# Patient Record
Sex: Female | Born: 1965 | Race: Asian | Hispanic: No | Marital: Married | Smoking: Never smoker
Health system: Southern US, Community
[De-identification: ages and names within clinical notes are randomized; demographics above are authoritative.]

## PROBLEM LIST (undated history)

## (undated) DIAGNOSIS — A159 Respiratory tuberculosis unspecified: Secondary | ICD-10-CM

## (undated) DIAGNOSIS — F419 Anxiety disorder, unspecified: Secondary | ICD-10-CM

## (undated) DIAGNOSIS — K219 Gastro-esophageal reflux disease without esophagitis: Secondary | ICD-10-CM

## (undated) HISTORY — DX: Respiratory tuberculosis unspecified: A15.9

## (undated) HISTORY — DX: Anxiety disorder, unspecified: F41.9

## (undated) HISTORY — DX: Gastro-esophageal reflux disease without esophagitis: K21.9

---

## 2000-02-02 ENCOUNTER — Encounter: Payer: Self-pay | Admitting: Obstetrics

## 2000-02-02 ENCOUNTER — Inpatient Hospital Stay (HOSPITAL_COMMUNITY): Admission: AD | Admit: 2000-02-02 | Discharge: 2000-02-02 | Payer: Self-pay | Admitting: Obstetrics & Gynecology

## 2000-02-04 ENCOUNTER — Inpatient Hospital Stay (HOSPITAL_COMMUNITY): Admission: AD | Admit: 2000-02-04 | Discharge: 2000-02-04 | Payer: Self-pay | Admitting: Obstetrics

## 2000-09-26 ENCOUNTER — Inpatient Hospital Stay (HOSPITAL_COMMUNITY): Admission: AD | Admit: 2000-09-26 | Discharge: 2000-09-29 | Payer: Self-pay | Admitting: Obstetrics and Gynecology

## 2001-11-08 ENCOUNTER — Other Ambulatory Visit: Admission: RE | Admit: 2001-11-08 | Discharge: 2001-11-08 | Payer: Self-pay | Admitting: Obstetrics and Gynecology

## 2002-11-15 ENCOUNTER — Other Ambulatory Visit: Admission: RE | Admit: 2002-11-15 | Discharge: 2002-11-15 | Payer: Self-pay | Admitting: Obstetrics and Gynecology

## 2002-11-20 ENCOUNTER — Ambulatory Visit (HOSPITAL_COMMUNITY): Admission: RE | Admit: 2002-11-20 | Discharge: 2002-11-20 | Payer: Self-pay | Admitting: Obstetrics and Gynecology

## 2002-11-20 ENCOUNTER — Encounter: Payer: Self-pay | Admitting: Obstetrics and Gynecology

## 2002-12-01 ENCOUNTER — Ambulatory Visit (HOSPITAL_BASED_OUTPATIENT_CLINIC_OR_DEPARTMENT_OTHER): Admission: RE | Admit: 2002-12-01 | Discharge: 2002-12-01 | Payer: Self-pay | Admitting: General Surgery

## 2002-12-01 ENCOUNTER — Encounter (INDEPENDENT_AMBULATORY_CARE_PROVIDER_SITE_OTHER): Payer: Self-pay | Admitting: *Deleted

## 2003-01-01 ENCOUNTER — Encounter: Admission: RE | Admit: 2003-01-01 | Discharge: 2003-01-01 | Payer: Self-pay | Admitting: Infectious Diseases

## 2003-01-08 ENCOUNTER — Encounter: Admission: RE | Admit: 2003-01-08 | Discharge: 2003-01-08 | Payer: Self-pay | Admitting: Infectious Diseases

## 2004-06-27 ENCOUNTER — Ambulatory Visit (HOSPITAL_COMMUNITY): Admission: RE | Admit: 2004-06-27 | Discharge: 2004-06-27 | Payer: Self-pay | Admitting: Obstetrics and Gynecology

## 2004-07-14 ENCOUNTER — Other Ambulatory Visit: Admission: RE | Admit: 2004-07-14 | Discharge: 2004-07-14 | Payer: Self-pay | Admitting: Obstetrics and Gynecology

## 2004-09-04 ENCOUNTER — Ambulatory Visit (HOSPITAL_COMMUNITY): Admission: RE | Admit: 2004-09-04 | Discharge: 2004-09-04 | Payer: Self-pay | Admitting: Obstetrics and Gynecology

## 2004-09-11 ENCOUNTER — Ambulatory Visit (HOSPITAL_COMMUNITY): Admission: RE | Admit: 2004-09-11 | Discharge: 2004-09-11 | Payer: Self-pay | Admitting: Obstetrics and Gynecology

## 2005-01-26 ENCOUNTER — Inpatient Hospital Stay (HOSPITAL_COMMUNITY): Admission: AD | Admit: 2005-01-26 | Discharge: 2005-01-29 | Payer: Self-pay | Admitting: Obstetrics and Gynecology

## 2006-07-28 ENCOUNTER — Other Ambulatory Visit: Admission: RE | Admit: 2006-07-28 | Discharge: 2006-07-28 | Payer: Self-pay | Admitting: Family Medicine

## 2006-11-21 IMAGING — US US OB COMP LESS 14 WK
1 series · 18 of 24 positions shown · non-contrast
Comparison: none

CLINICAL DATA: 7-week 5-day gestational age by LMP.  Vaginal bleeding and cramping. 
 OBSTETRICAL ULTRASOUND:
 A single living intrauterine gestation is seen with measured heart rate of 158.  Embryonic crown rump length measures 1.6 cm, corresponding to a gestational age of 8 weeks 1 day.  A normal-appearing yolk sac is seen.  There is no evidence of subchorionic hemorrhage, and no other maternal uterine abnormalities were seen.  
 The left ovary is normal in appearance.  The right ovary contains a corpus luteum cyst measuring 2.6 x 2.2 x 2.6 cm.  No adnexal masses or free fluid are identified.

[Series 1: us ob comp<14 wk · 18 of 24 slices shown]
[im 1/24]
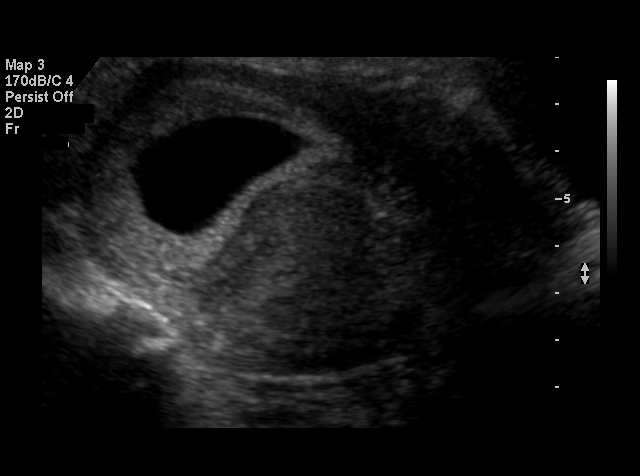
[im 3/24]
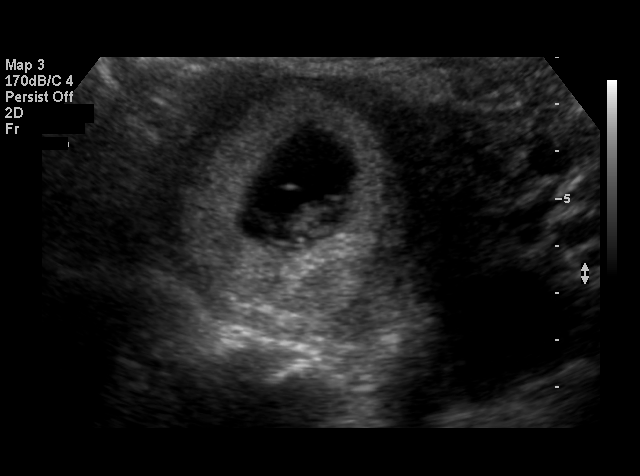
[im 4/24]
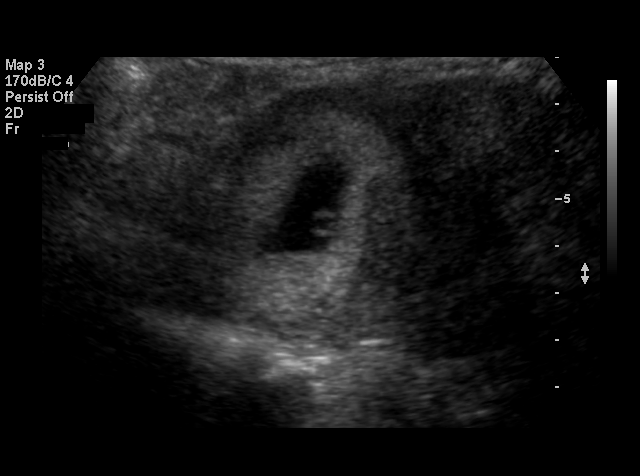
[im 5/24]
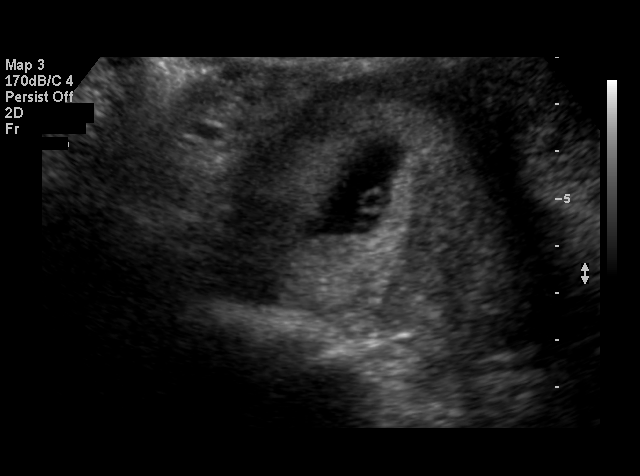
[im 7/24]
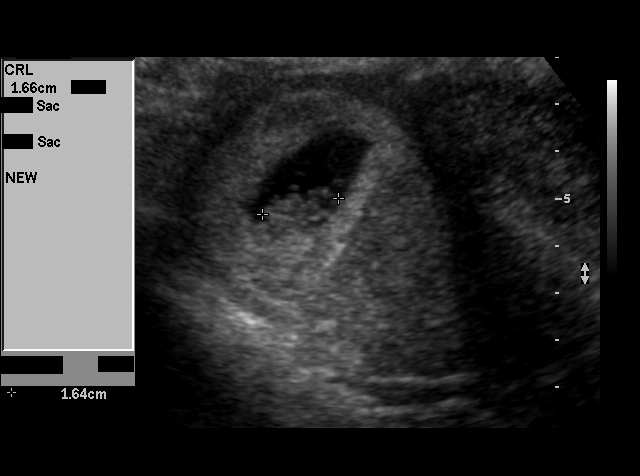
[im 8/24]
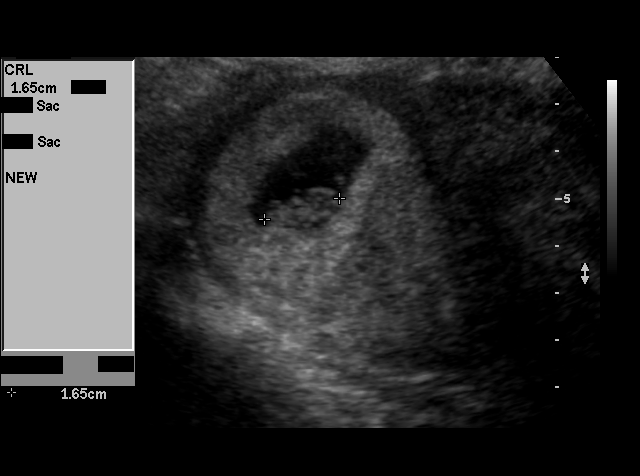
[im 9/24]
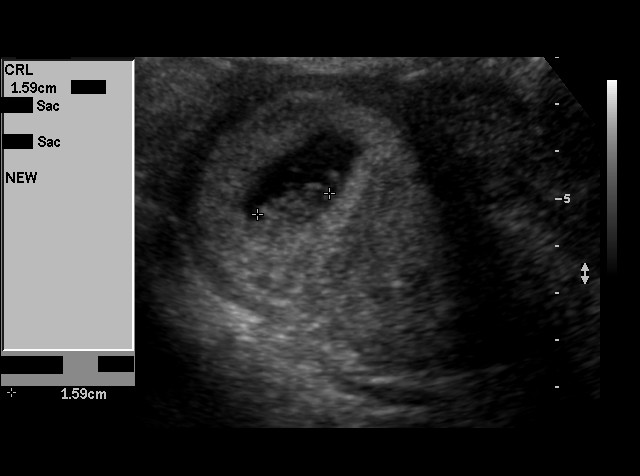
[im 11/24]
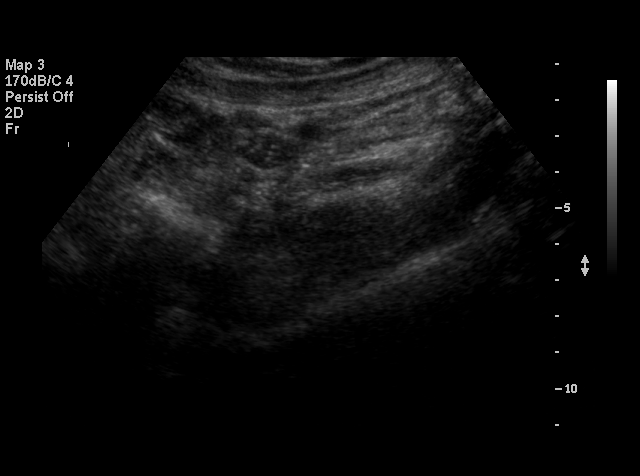
[im 12/24]
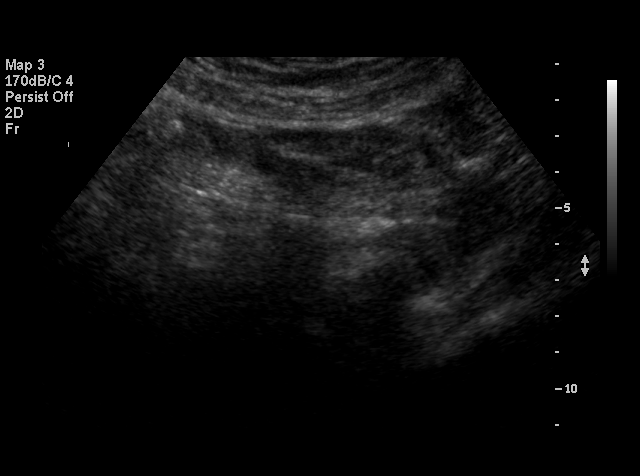
[im 13/24]
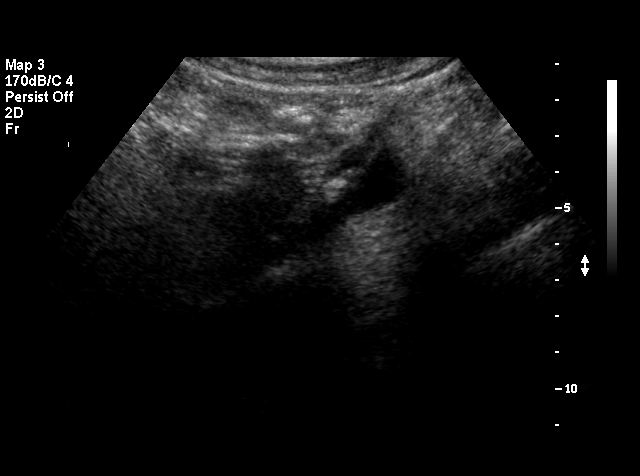
[im 15/24]
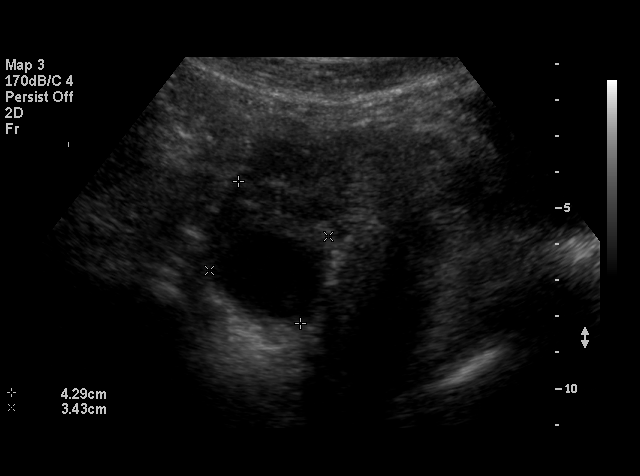
[im 16/24]
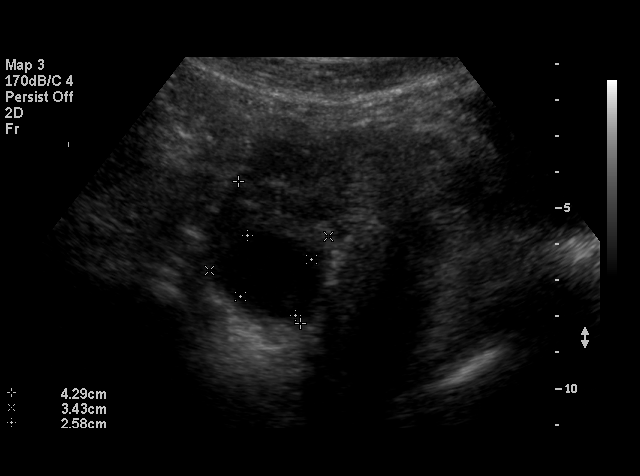
[im 17/24]
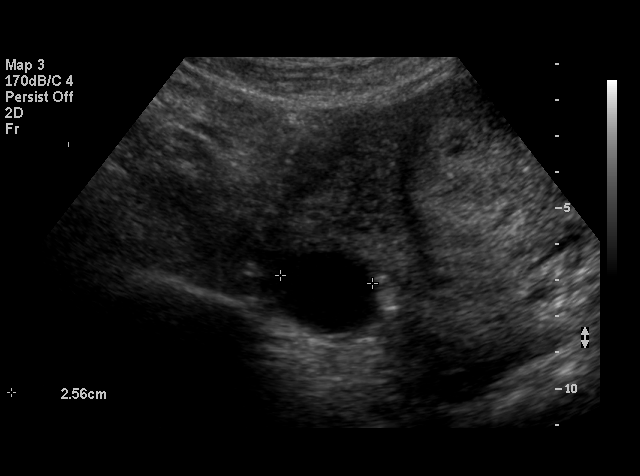
[im 19/24]
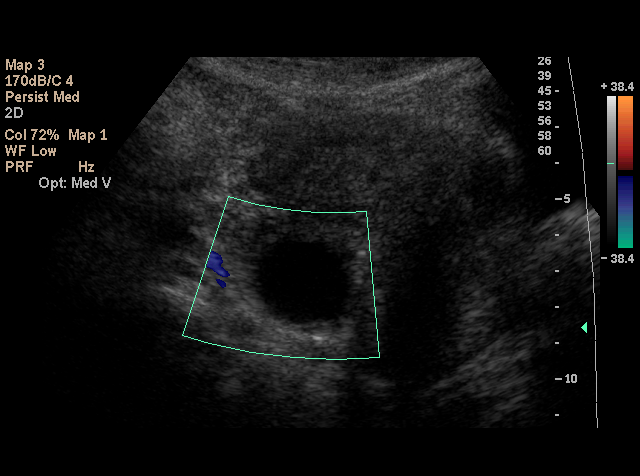
[im 20/24]
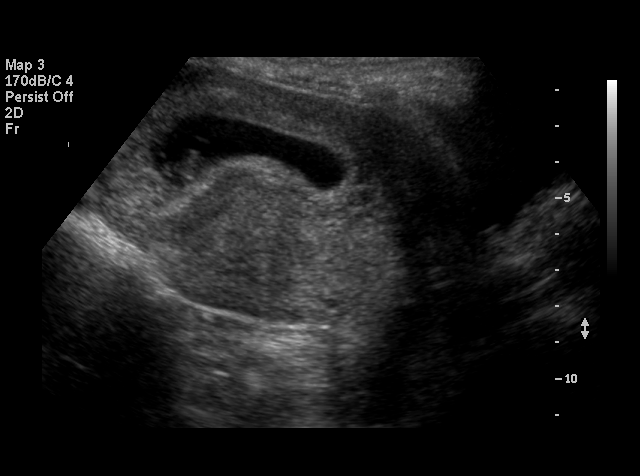
[im 21/24]
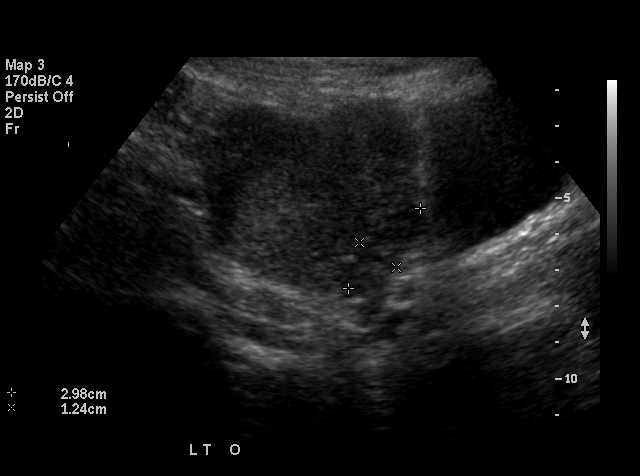
[im 23/24]
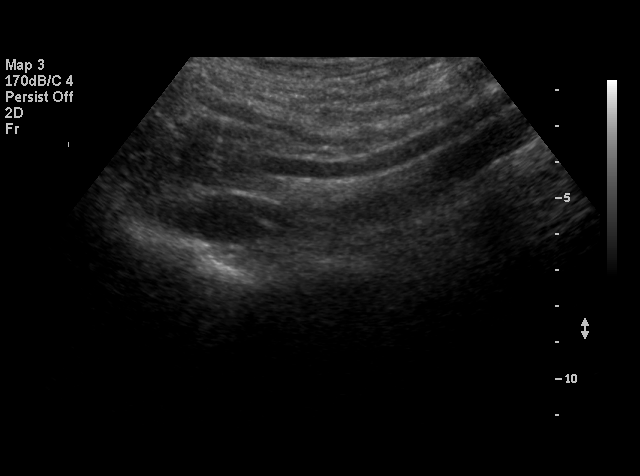
[im 24/24]
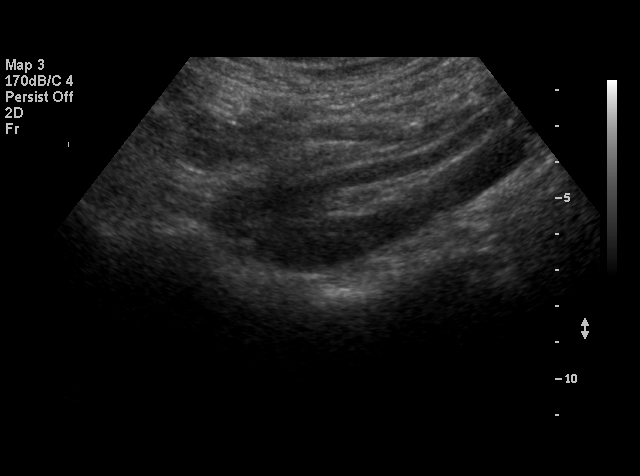

[18 of 24 positions shown; findings below may reference images not displayed]

IMPRESSION: 1.  Single living intrauterine gestation with estimated gestational age of 8 weeks 1 day and sonographic EDC of 02/05/05.  This correlates well with LMP.  
 [DATE] cm right ovarian corpus luteum noted.

## 2007-02-05 IMAGING — US US AMNIOCENTESIS
1 series · 5 of 5 positions shown · non-contrast
Comparison: none

[Series 1: us amniocentesis · 5 of 5 slices shown]
[im 1/5]
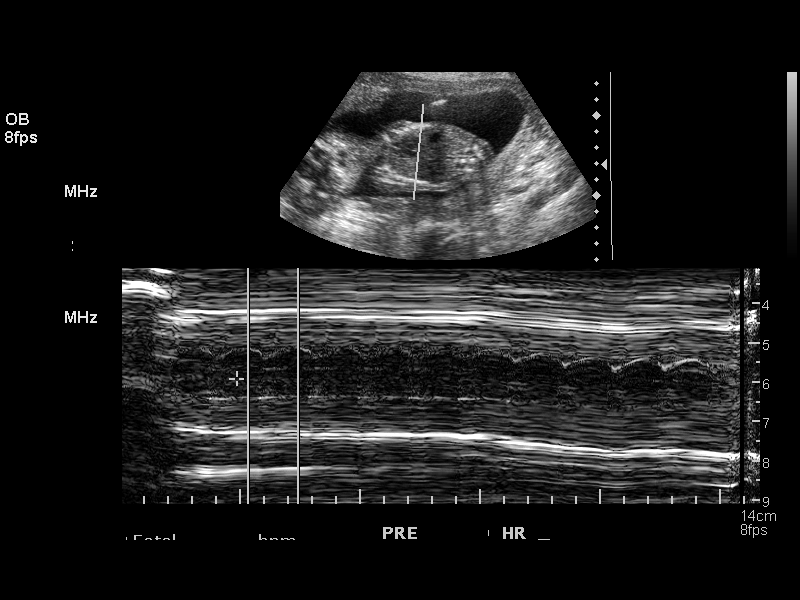
[im 2/5]
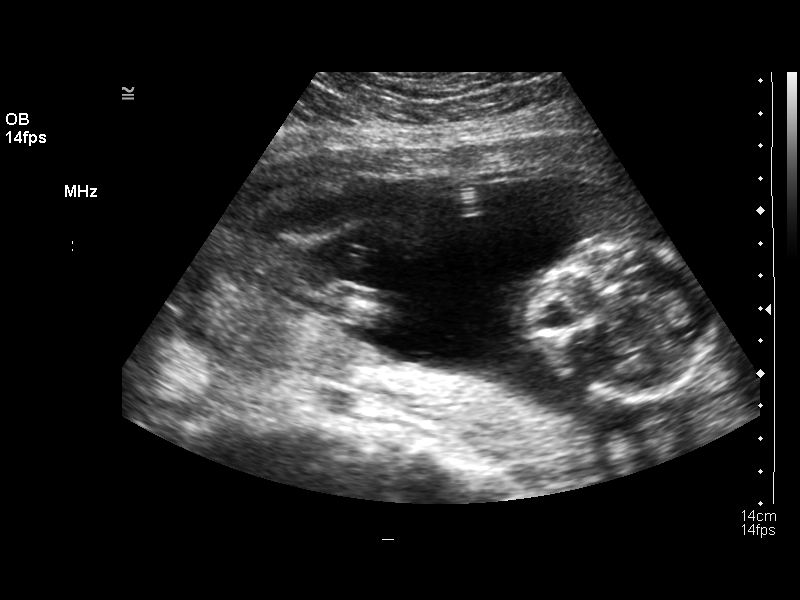
[im 3/5]
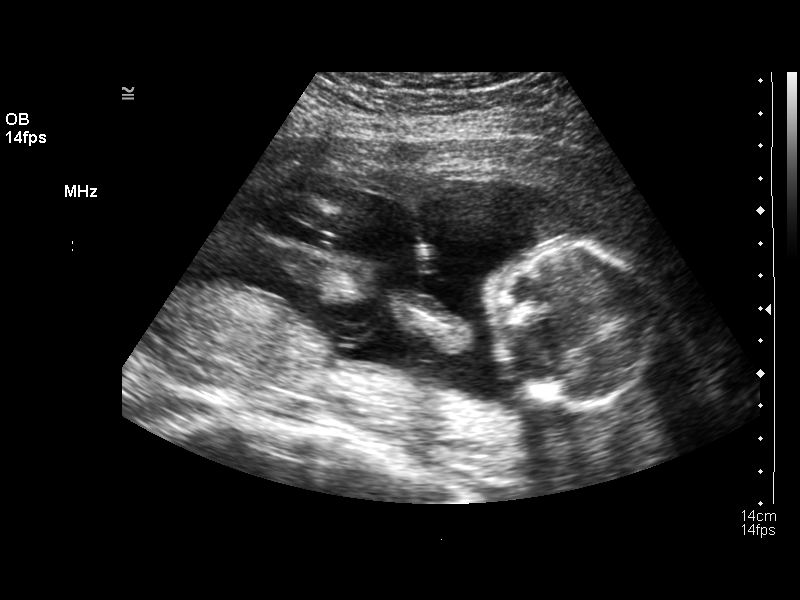
[im 4/5]
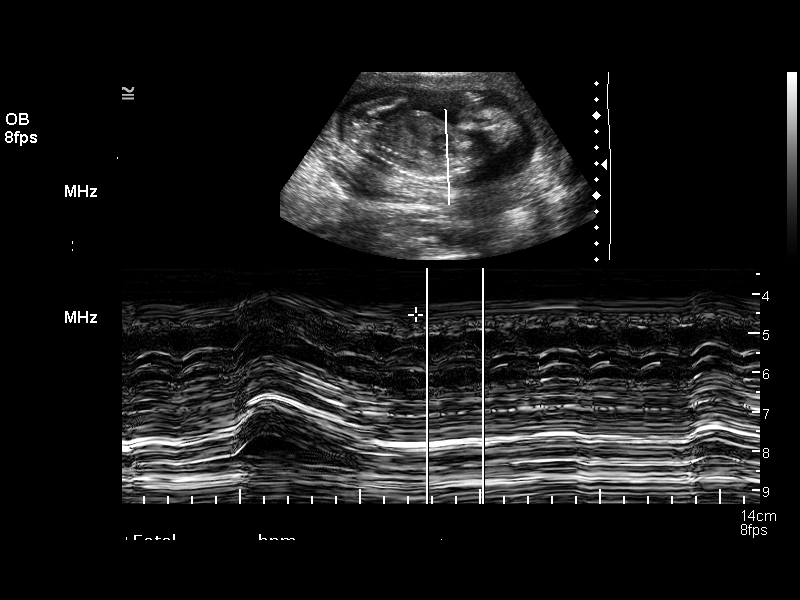
[im 5/5]
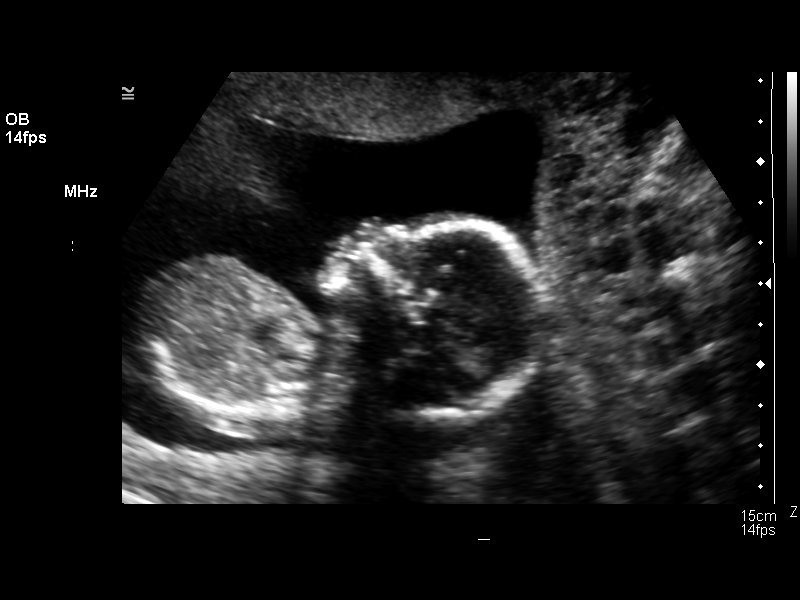

[5 of 5 positions shown; findings below may reference images not displayed]

ULTRASOUND AMNIOCENTESIS:

 Ultrasound was utilized to perform amniocentesis by the requesting physician.

## 2008-10-22 ENCOUNTER — Other Ambulatory Visit: Admission: RE | Admit: 2008-10-22 | Discharge: 2008-10-22 | Payer: Self-pay | Admitting: Family Medicine

## 2010-03-17 ENCOUNTER — Other Ambulatory Visit: Admission: RE | Admit: 2010-03-17 | Discharge: 2010-03-17 | Payer: Self-pay | Admitting: Family Medicine

## 2010-11-07 NOTE — Op Note (Signed)
NAMEJALIE, EILAND                   ACCOUNT NO.:  [id number]   MEDICAL RECORD NO.:  [id number]          PATIENT TYPE:  INP   LOCATION:  9128                          FACILITY:  [facility]   PHYSICIAN:  Crist Fat. Rivard, M.D. DATE OF BIRTH:  03/28/1966   DATE OF PROCEDURE:  01/26/2005  DATE OF DISCHARGE:                                 OPERATIVE REPORT   PREOPERATIVE DIAGNOSIS:  Intrauterine pregnancy at 38 weeks 4 days in early  labor with previous cesarean section, who desires repeat cesarean section.   POSTOPERATIVE DIAGNOSIS:  Intrauterine pregnancy at 38 weeks 4 days in early  labor with previous cesarean section, who desires repeat cesarean section.   ANESTHESIA:  Spinal.   PROCEDURE:  Repeat low transverse cesarean section.   SURGEON:  Crist Fat. Rivard, M.D.   ASSISTANTRenaldo Reel. Latham, C.N.M.   ESTIMATED BLOOD LOSS:  500 mL.   PROCEDURE:  After being informed of the planned procedure with possible  complications including bleeding, infection, injury to bowels, bladder and  ureters, through the translation line, informed consent is obtained.  The  patient is taken to cesarean suite and given spinal anesthesia without  complication.  She is placed in a dorsal decubitus position, pelvis tilted  to the left, prepped and draped in a sterile fashion, and a Foley catheter  is inserted in her bladder.  After assessing adequate level of anesthesia,  her previous incision is infiltrated with 20 mL of Marcaine 0.25% and we  perform a Pfannenstiel incision overlying the first incision, which is  brought down sharply to the fascia.  Fascia is opened in a low transverse  fashion.  Linea alba is dissected.  Peritoneum is entered sharply.  Visceral  peritoneum is entered in a low transverse fashion allowing [place] to safely  retract bladder by developing a bladder flap.  Myometrium is then opened in low transverse fashion first sharply, then  extended bluntly.  Amniotic fluid is clear and  abundant.  We assist the  birth of a female infant in vertex presentation at 9:55 a.m.  Mouth and nose  are suctioned.  Baby is delivered.  Cord is clamped with two Kelly clamps  and sectioned and the baby is given to the pediatrician present in the room.  Ancef 2 g IV was given to the patient and 20 mL of blood is drawn from the  umbilical vein.  The placenta is manually extracted, is complete, cord has  three vessels, and uterine revision is negative.   Myometrium is closed in two layers, first with a running locked suture of 0  Vicryl, then with a Lembert suture of 0 Vicryl imbricating the first one.  Hemostasis is completed with cautery on the peritoneal edges.  Hemostasis  was then deemed adequate.  Both paracolic gutters are cleansed, both tubes  and ovaries assessed and normal.  Pelvis is then profusely irrigated with  warm saline and hemostasis is rechecked and again deemed adequate.   Under fascia hemostasis is completed with cautery and fascia is closed with  two running sutures of #1  Vicryl meeting midline.  Incision is irrigated  with warm saline, hemostasis was completed with cautery, and skin is closed  with a subcuticular suture of 4-0 Monocryl and Steri-Strips.   Instrument and sponge count is complete x2.  Estimated blood loss is 500 mL.  The procedure is well tolerated by the patient, who is taken to recovery  room in a well and stable condition.   A little girl named Joylene Draft was born at 9:55 a.m., received an Apgar of 9 at  one minutes, 9 at five minutes, and weighs 6 pounds 12 ounces.       SAR/MEDQ  D:  01/26/2005  T:  01/26/2005  Job:  [id number]

## 2010-11-07 NOTE — Op Note (Signed)
Jenny Mckenzie, Jenny Mckenzie                               ACCOUNT NO.:  [id number]   MEDICAL RECORD NO.:  [id number]                   PATIENT TYPE:  AMB   LOCATION:  DSC                                  FACILITY:  [facility]   PHYSICIAN:  Anselm Pancoast. Zachery Dakins, M.D.          DATE OF BIRTH:  1965/12/04   DATE OF PROCEDURE:  12/01/2002  DATE OF DISCHARGE:                                 OPERATIVE REPORT   PREOPERATIVE DIAGNOSIS:  Cervical lymphadenopathy.   POSTOPERATIVE DIAGNOSIS:  Await pathology.   OPERATION:  Excision of three cervical lymph nodes, midchain.   ANESTHESIA:  Local anesthesia.   ASSISTANT:  Nurse.   HISTORY:  [name] is a 45 year old [place] female who was referred to [place]  for cervical lymphadenopathy.  The patient has had some cervical enlarged  nodes for the last couple of weeks.  They are slightly tender.  She was  treated with Keflex for a week with no improvement and was referred for  surgical biopsy.  Her previous history was that about 20 years ago while  living in [place] had a similar cervical lymph node biopsy on the opposite  side.  There was some type of infection treated with antibiotics, and she  does not know the details.  Her English is fairly limited, but her sister-in-  law, who has accompanied her, does most of the translating.  On exam, there  are obviously cervical nodes, not one big node like a usual lymphoma, but  the largest cluster is in the midcervical area.  I recommended that we make  a little incision over this and excise the areas and then send them for path  exam and also save one for culture if it is an infectious process.   PROCEDURE IN DETAIL:  The area picked was marked, and then a small area  infiltrated with Xylocaine with adrenalin.  A small curved, straight  incision in the skin fold lines were made, and the underlying subcutaneous  tissue was separated from the underlying platysma muscle.  I then made a  little vertical incision  between the muscle fibers, exposing the larger of  the nodes and then dissected after anesthetizing this with a few mL of the  same solution so that two of the nodes, which are 1.5 cm in size could be  freed up.  There was a little, I think, sensory nerve then identified, and  this was protected at the posterior portion of the wound.  I freed up the  areas.  The fascicle of pedicle was ligated with a 4-0 Vicryl, and then the  larger of the nodes, I cut it in half, saved the smaller portion in a  sterile container, and sent over together so Dr. Clelia Croft can examine the  specimen, do touch preps, and etc., and then do any cultures as she thinks  is appropriate.  The procedure was tolerated nicely.  The little muscle area  was closed with a 4-0 Vicryl.  There was minimal bleeding and then the  subcuticular 4-0 Vicryl with two sutures and Benzoin and Steri-Strips on the  skin incision.  We will see her back on Tuesday and will have the path  report.  She is not on any antibiotics.  We will give her Vicodin for pain.  Instructed to keep the area dry until she is seen in the office on Tuesday.                                               Anselm Pancoast. Zachery Dakins, M.D.    WJW/MEDQ  D:  12/01/2002  T:  12/01/2002  Job:  [id number]

## 2010-11-07 NOTE — Op Note (Signed)
[facility]  Patient:    Jenny Mckenzie, Jenny Mckenzie Visit Number: [id number] MRN: [id number]          Service Type: OBS Location: 9300 [id number] Attending Physician:  Shaune Spittle Proc. Date: 09/26/00 Admit Date:  09/26/2000                             Operative Report  PREOPERATIVE DIAGNOSIS:       1. Intrauterine pregnancy at term.                               2. Spontaneous rupture of membranes in labor.                               3. Breach presentation.  POSTOPERATIVE DIAGNOSIS:      1. Intrauterine pregnancy at term.                               2. Spontaneous rupture of membranes in labor.                               3. Breach presentation.  OPERATION:                    Primary low transverse cesarean section.  SURGEON:                      Vanessa P. Pennie Rushing, M.D.  ASSISTANT:                    Mack Guise, C.N.M.  ANESTHESIA:                   Spinal.  ESTIMATED BLOOD LOSS:         750 cc  COMPLICATIONS:                None.  FINDINGS:                     The patient was delivered of a female infant whose name is Maureen Ralphs, weighing 6 pounds 4 ounces with Apgars of 8/9 at 1 and 5 minutes, respectively.  She had a double nuchal cord.  The uterus, tubes, and ovaries were normal for the gravid state.  DESCRIPTION OF PROCEDURE:     The patient was taken to the operating room after appropriate identification and placed on the operating table.  After placement of a spinal anesthetic, she was placed in the supine position with the left lateral tilt.  The abdomen and perineum prepped with multiple layers of Betadine and a Foley catheter inserted into the bladder under sterile conditions and connected to straight drainage.  The abdomen was draped as a sterile field.  A transverse incision was made in the abdomen after assurance of adequate anesthesia and the abdomen opened in layers.  The peritoneum was entered.  The uterus was  incised approximately 1 cm above the uterovesical fold, and the infant delivered from the left sacrum anterior position as a frank breach.  The cord was clamped and cut, and the infant handed off to the awaiting pediatricians.  Appropriate cord blood was drawn and placenta spontaneously separated from the  uterus and was removed from the operative field.  The uterine incision was closed with a running interlocking suture of 0 Vicryl.  An imbricating suture of 0 Vicryl was placed.  Hemostasis was noted to be adequate.  Copious irrigation carried out and the abdominal peritoneum was closed with a running suture of 2-0 Vicryl.  The rectus muscles were reapproximated in the midline with figure-of-eight sutures of 2-0 Vicryl.  The rectus muscles were noted to be hemostatic and were irrigated.  The rectus fascia was closed with a running suture of 0 Vicryl then reinforced on either side of midline with figure-of-eight sutures of 0 Vicryl.  The subcutaneous tissue was irrigated and made hemostatic with Bovie cautery.  Skin staples were applied to the skin incision.  A sterile dressing was applied and the patient was taken from the operating room to the recovery room in satisfactory condition having tolerated the procedure well with sponge and instrument counts correct.  The infant went to the full-term nursery. Attending Physician:  Shaune Spittle DD:  09/26/00 TD:  09/27/00 Job: [id number]

## 2010-11-07 NOTE — Discharge Summary (Signed)
[name], [name]                   ACCOUNT NO.:  [id number]   MEDICAL RECORD NO.:  [id number]          PATIENT TYPE:  INP   LOCATION:  9128                          [facility]ACILITY:  [facility]   PHYSICIAN:  Hal Morales, M.D.DATE OF BIRTH:  10-12-1965   DATE OF ADMISSION:  01/26/2005  DATE OF DISCHARGE:                                 DISCHARGE SUMMARY   ADMITTING DIAGNOSES:  1.  Intrauterine pregnancy at term.  2.  Previous cesarean section.   DISCHARGE DIAGNOSES:  1.  Intrauterine pregnancy at term.  2.  Repeat low transverse cesarean section.   PROCEDURES:  Repeat low transverse cesarean section.   HOSPITAL COURSE:  Jenny Mckenzie is a 45 year old gravida 2 para 1-0-0-1 who  presented on January 26, 2005 in spontaneous onset of labor. Pregnancy has  been remarkable for:  1.  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  2.  History of previous low transverse cesarean section. The patient desired      repeat low transverse cesarean section.   The patient was taken to cesarean section and delivered a viable female  infant, Renea Ee, weight 6 pounds 12 ounces with Apgars of 9 and 9 at one and  five minutes respectively. The patient was breastfeeding. Hemoglobin was  12.0 on day #1 postpartum. By day #3 postpartum and postoperatively, the  patient was doing well and was deemed to have received the full benefit of  her hospital stay. She was discharged home. The patient desires to consider  contraception and will have a full decision at her 6-week office visit. The  patient has reported some intermittent nausea and upper abdominal pain  during her hospital stay which she reports was present prior to pregnancy  for which she requests medication.   DISCHARGE CONDITION:  Stable.   DISCHARGE INSTRUCTIONS:  Per [facility] OB/GYN handout.   DISCHARGE MEDICATIONS:  1.  Motrin 600 mg p.o. q.6h. p.r.n. pain.  2.  Tylox one to two p.o. q.3-4h. p.r.n. pain.  3.  Prenatal vitamins one p.o. daily.  4.  Nexium 40 mg  p.o. daily.   DISCHARGE FOLLOW-UP:  Will occur at 6 weeks postpartum at [facility].     ______________________________  Elsie Ra, C.N.M.      Hal Morales, M.D.  Electronically Signed    NS/MEDQ  D:  01/29/2005  T:  01/29/2005  Job:  [id number]

## 2010-11-07 NOTE — Op Note (Signed)
Jenny Mckenzie, Jenny Mckenzie                   ACCOUNT NO.:  [id number]   MEDICAL RECORD NO.:  [id number]          PATIENT TYPE:  OUT   LOCATION:  ULT                           [facility]   PHYSICIAN:  Hal Morales, M.D.DATE OF BIRTH:  July 17, 1965   DATE OF PROCEDURE:  09/11/2004  DATE OF DISCHARGE:                                 OPERATIVE REPORT   PREOPERATIVE DIAGNOSES:  1.  Intrauterine pregnancy at 18 weeks' gestation.  2.  Increased risk of Down syndrome.  3.  Advanced maternal age.   POSTOPERATIVE DIAGNOSES:  1.  Intrauterine pregnancy at 18 weeks' gestation.  2.  Increased risk of Down syndrome on maternal serum screening.  3.  Advanced maternal age.   OPERATION:  Genetic amniocentesis.   ANESTHESIA:  Local.   ESTIMATED BLOOD LOSS:  Less than 5 mL.   COMPLICATIONS:  None.   FINDINGS:  The patient has an anterior placenta and adequate amniotic fluid.  Blood type B positive   PROCEDURE:  After a preliminary ultrasound identifying a pocket of fluid  near the umbilicus and to the right of midline, this area was cleansed with  Betadine and infiltrated with 1% Xylocaine.  Initially, the 20 gauge  UltraView needle was used to try and access the fluid pocket; however, the  infant quickly moved into the pocket of fluid which had been intended for  use.  The needle was noted to contain a small amount of blood and was  retracted with the Stylet in place.  Ultrasound was repeated and the pocket  of fluid again identified to be empty of fetal parts.  A #22 gauge needle  was then used to access the pocket of amniotic fluid.  The fluid was  initially blood-tinged but cleared rapidly.  Then 5 mL were placed in the  first syringe and 10 mL in the second syringe.  The post amniocentesis heart  rate, after removal of the needle, and documentation of the amnio site was  within normal limits.  Blood type is B+.  The patient was then given printed  instructions for post amniocentesis care,  and the amniotic fluid was sent to  [facility] for [facility] evaluation and routine chromosome  analysis.      VPH/MEDQ  D:  09/11/2004  T:  09/11/2004  Job:  [id number]

## 2010-11-07 NOTE — H&P (Signed)
[facility]  Patient:    Jenny Mckenzie, Jenny Mckenzie                            MRN: [id number] Adm. Date:  [id number] Attending:  Shaune Spittle Dictator:   Vance Gather Duplantis, C.N.M.                         History and Physical  HISTORY OF PRESENT ILLNESS:   Jenny Mckenzie is a 45 year old married Asian female, gravida 1, para 0, at 39-2/7 weeks, who presents complaining of uterine contractions and a slimy watery discharge with some blood noted about 1 a.m. She reports positive fetal movement.  She denies any nausea, vomiting, headaches or visual disturbances.  Her pregnancy has been followed at [facility] by the certified nurse midwife service and has been essentially uncomplicated, though at risk for history of first trimester spotting.  Her group B strep is negative.  OBSTETRICAL/GYNECOLOGICAL HISTORY:  She is a primigravida.  She has had no GYN problems.  ALLERGIES:                    She has no known drug allergies.  GENERAL MEDICAL HISTORY:      It is unknown whether she has had the childhood diseases.  She reports no medical problems.  Her only surgery was her wisdom teeth removed.  FAMILY HISTORY:               Her family history is significant for maternal uncle with MI, mother with TB and some questionable thyroid disease.  GENETIC HISTORY:              Negative.  SOCIAL HISTORY:               She is married to [place] and they are of the Catholic faith.  They deny any illicit drug use, alcohol or smoking with this pregnancy.  PRENATAL LABORATORY DATA:     Her blood type is B-positive.  Her antibody screen is negative.  Sickle cell trait is negative.  Syphilis is nonreactive. Rubella is immune.  Hepatitis B surface antigen is negative.  GC and Chlamydia are both negative.  Pap is within normal limits.  Her one-hour glucola was within normal range and she declined her maternal serum alpha-fetoprotein. Her 36-week beta strep was  negative.  PHYSICAL EXAMINATION:  VITAL SIGNS:                  Her vital signs are stable.  She is afebrile.  HEENT:                        Her HEENT is grossly within normal limits.  HEART:                        Her heart is regular rate and rhythm.  CHEST:                        Her chest is clear.  BREASTS:                      Breasts are soft and nontender.  ABDOMEN:                      Her abdomen is gravid with  uterine contractions every two to three minutes.  Her fetal heart rate is reactive and reassuring.  PELVIC:                       Her cervix changed from about 1 cm on admission to now 2 to 3 cm after walking for two hours and this morning she noted a large gush of clear fluid and, indeed, has positive rupture of the membranes noted at that time.  EXTREMITIES:                  Her extremities are within normal limits.  ASSESSMENT:                   1. Intrauterine pregnancy at 39-2/7 weeks.                               2. Early labor.                               3. Negative group B streptococcus.                               4. Spontaneous rupture of the membranes.  PLAN:                         Her plan is to admit to labor and delivery, to follow routine C.N.M. orders and to notify Dr. Maris Berger. [name] of patients admission. DD:  09/26/00 TD:  09/26/00 Job: [id number]

## 2010-11-07 NOTE — Discharge Summary (Signed)
[facility]  Patient:    Jenny Mckenzie, Jenny Mckenzie                            MRN: [id number] Adm. Date:  [id number] Attending:  Shaune Spittle Dictator:   Mack Guise, C.N.M.                           Discharge Summary  ADMISSION DIAGNOSES:          1. Intrauterine pregnancy at term.                               2. Spontaneous rupture of membranes.                               3. Breech.  DISCHARGE DIAGNOSES:          1. Intrauterine pregnancy at term.                               2. Spontaneous rupture of membranes.                               3. Breech.                               4. Primary low transverse cesarean delivery.  PROCEDURES:                   Primary low transverse cesarean delivery.  HOSPITAL COURSE:              [name] is a 45 year old gravida 1, para 0 at 39-2/7 weeks who presented with uterine contractions and spontaneous rupture of membranes for clear fluid.  She was found to be breech and proceeded to C-section by Maris Berger. Pennie Rushing, M.D.  Her postop course has been unremarkable.  Hemoglobin on the first postop day is 11.9, preop hemoglobin 14.2.  The patient had a birth of a 6 pound 4 ounce female infant with Apgar scores of 8 at one minute and 9 at five minutes.  On this, her third postop day, she is noted to be in satisfactory condition for discharge.  DISCHARGE FOLLOW-UP:          Per [facility] OB/GYN handout.  DISCHARGE MEDICATIONS:        1. Motrin 600 mg p.o. q.6h. p.r.n. pain.                               2. Tylox one to two p.o. q.3-4h. pain.                               3. Prenatal vitamins.   FOLLOW-UP:                    The patient will follow up in the office of [facility] in six weeks. DD:  09/29/00 TD:  09/29/00 Job: [id number]

## 2010-11-07 NOTE — H&P (Signed)
NAMEJOLEAH, KOSAK                   ACCOUNT NO.:  [id number]   MEDICAL RECORD NO.:  [id number]          PATIENT TYPE:  INP   LOCATION:  9198                          FACILITY:  [facility]   PHYSICIAN:  Crist Fat. Rivard, M.D. DATE OF BIRTH:  [birth year]   DATE OF ADMISSION:  01/26/2005  DATE OF DISCHARGE:                                HISTORY & PHYSICAL   Jenny Mckenzie is a 45 year old gravida 2, para 1-0-0-1, at 38-4/7 weeks, who  presents with uterine contractions approximately every four minutes since  early a.m.  She reports she has been contracting for the last 24 hours on  and off.  She denies leaking and bleeding and reports positive fetal  movement.  The patient's husband is present with her as an interpreter.  The  pregnancy has been remarkable for:   1.  Language barrier.  The patient does seem to understand some English, but      we have also used her husband and other family members as interpreters      during her pregnancy.  2.  Previous cesarean section with patient's plan for repeat, but no date      was already scheduled.  3.  History of TB in 2004.  4.  Advanced maternal age, normal amniocentesis following an elevated Down      syndrome risk on quadruple screen.  5.  Elevated one-hour GTT with one abnormal value on three-hour GTTs but no      diagnosis of gestational diabetes.   PRENATAL LABORATORY DATA:  Blood type is B positive, Rh antibody negative.  VDRL nonreactive.  Rubella titer positive.  Hepatitis B surface antigen  negative.  HIV was declined.  Cystic fibrosis testing was declined.  Sickle  cell trait, her hemoglobin electrophoresis was normal.  GC and Chlamydia  cultures were negative in December.  Pap smear was normal.  Hemoglobin upon  entering the practice was 13.6.  It was 13.1 at 30 weeks.  She did have an  elevated Down syndrome risk on quadruple screen of 1 in 15.  Amniocentesis  was reviewed with the patient, and this was performed.  She had a normal  female  noted on amniocentesis.  She had some sporadic glycosuria during her  pregnancy.  At 14-15 weeks she had 4+ glycosuria and Dextrostix of 149.  A  one-hour Glucola was done at 30 weeks with a value of 149.  Three-hour  Glucola was done with a one-hour value that was elevated.  This was  identified, however, as normal.  She had another episode of glycosuria at 35  weeks with a Dextrostix of 162.  A plan was made for a fasting and two-hour  p.c.  I do not have that report noted on her chart at this time.  GC,  Chlamydia and group B strep were negative at 36 weeks.  EDC of February 05, 2005, was established by ultrasound at six weeks.   HISTORY OF PREGNANCY:  The patient entered care at approximately six weeks  secondary to some first trimester spotting.  Ultrasound was done  for  viability.  She had a cervical polyp identified early in pregnancy.  She was  also placed on a Z-Pak and Tussionex at her first visit for upper  respiratory issues.  The cervical polyp was evaluated by Dr. Stefano Gaul at 11  weeks.  Her Pam smear was normal.  Initially she had declined amniocentesis  but then had an elevated quadruple screen risk for Down syndrome.  She also  had an ultrasound at 18 weeks that showed small bilateral choroid plexus  cyst, which adjusted the risk of Down syndrome to 1 in 129.  A decision was  made by the patient and her husband to proceed with amniocentesis.  This was  done, and a normal female was noted.  She had a Glucola at 33 weeks, which  was slightly elevated.  Three-hour GTT, as previously noted, was abnormal  one-hour value.  She had another ultrasound at 34 weeks with normal growth,  AFI was 7.7.  She was placed on bed rest.  Had a repeat ultrasound at 36  weeks with normal fluid, normal BPP and normal growth.  She continued on  increased rest.  Had a follow-up ultrasound that was scheduled at  approximately 38 weeks.  I do not have that report noted on the chart.  GC,  Chlamydia  and group B strep culture were all negative.   OBSTETRICAL HISTORY:  In April 2002, the patient had a primary low  transverse cesarean section by Dr. Pennie Rushing for breech presentation.  This  infant was a female, weight 6 pounds 4 ounces, at 39 weeks.  She had spinal  anesthesia and no complications.  She had no complications during that  pregnancy.   MEDICAL HISTORY:  She had TB diagnosed in 2004.  She was treated for six  months.  She also has a history of ulcers, which were diagnosed in 1989, but  nothing other than Tums was used.   SURGICAL HISTORY:  Wisdom teeth removed and the previously-noted C-section  in 2002.   She has no known medication allergies.   FAMILY HISTORY:  Her maternal uncle had an MI and is now deceased.  Her  mother is hypertensive on medication.  Her mother also had TB in 59.  The  patient's paternal uncle has asthma.  Her mother has thyroid issues.   GENETIC HISTORY:  Remarkable for the patient being age 72 at the time of  delivery and the father of the baby's brother has had a history of  hydrocephaly.   SOCIAL HISTORY:  The patient is married to the father of the baby.  He is  involved and supportive.  His name is Hilja Kintzel.  The patient is [place]  of the [employer].  She has a high school education  She is a Futures trader.  Her husband is also high school educated.  He is a Production designer, theatre/television/film.  She  has been followed by the physician service of [facility].  She  denies any alcohol, drug or tobacco use during this pregnancy.   PHYSICAL EXAMINATION:  VITAL SIGNS:  Stable.  The patient is afebrile.  HEENT:  Within normal limits.  LUNGS:  Bilateral breath sounds are clear.  CARDIAC:  Regular rate and rhythm without murmur.  BREASTS:  Soft and nontender.  ABDOMEN:  Fundal height is approximately 38 cm, estimated fetal weight is probably 6 to 6-1/2 pounds.  Uterine contractions are every three to four  minutes, 60 seconds in duration,  moderate quality.  Fetal heart rate is  reactive with no decelerations.  Uterine contractions approximately every  three minutes.  PELVIC:  Cervix is 2-3 cm, 50%, vertex at a -2 station.  EXTREMITIES:  Deep tendon reflexes are 2+ without clonus.  There is a trace  edema noted.   IMPRESSION:  1.  Intrauterine pregnancy at 38-4/7 weeks.  2.  Early labor.  3.  Previous cesarean section with desire for repeat.  4.  Negative group B strep.   PLAN:  1.  Admit to [facility] per consult with Dr. Estanislado Pandy as attending      physician.  2.  Routine physician preop orders.  3.  Reviewed operative consent and risks and benefits of cesarean section      with the patient and her husband via the [employer].  The      patient and husband seem to understand the risks and benefits of      cesarean section and do wish to proceed with the procedure.  A tubal was      declined.       VLL/MEDQ  D:  01/26/2005  T:  01/26/2005  Job:  [id number]

## 2012-05-11 ENCOUNTER — Other Ambulatory Visit (HOSPITAL_COMMUNITY)
Admission: RE | Admit: 2012-05-11 | Discharge: 2012-05-11 | Disposition: A | Discharge status: Home / Self Care | Payer: BC Managed Care – PPO | Source: Ambulatory Visit | Attending: Family Medicine | Admitting: Family Medicine

## 2012-05-11 ENCOUNTER — Other Ambulatory Visit: Payer: Self-pay | Admitting: Family Medicine

## 2012-05-11 DIAGNOSIS — Z01419 Encounter for gynecological examination (general) (routine) without abnormal findings: Secondary | ICD-10-CM | POA: Insufficient documentation

## 2015-10-10 ENCOUNTER — Other Ambulatory Visit (HOSPITAL_COMMUNITY)
Admission: RE | Admit: 2015-10-10 | Discharge: 2015-10-10 | Disposition: A | Discharge status: Home / Self Care | Payer: BLUE CROSS/BLUE SHIELD | Source: Ambulatory Visit | Attending: Physician Assistant | Admitting: Physician Assistant

## 2015-10-10 ENCOUNTER — Other Ambulatory Visit: Payer: Self-pay | Admitting: Physician Assistant

## 2015-10-10 DIAGNOSIS — Z01419 Encounter for gynecological examination (general) (routine) without abnormal findings: Secondary | ICD-10-CM | POA: Diagnosis present

## 2015-10-10 DIAGNOSIS — Z1151 Encounter for screening for human papillomavirus (HPV): Secondary | ICD-10-CM | POA: Insufficient documentation

## 2015-10-11 LAB — HM PAP SMEAR: HM PAP: NEGATIVE

## 2015-10-11 LAB — CYTOLOGY - PAP

## 2018-03-24 ENCOUNTER — Ambulatory Visit (INDEPENDENT_AMBULATORY_CARE_PROVIDER_SITE_OTHER): Payer: PRIVATE HEALTH INSURANCE | Admitting: Emergency Medicine

## 2018-03-24 ENCOUNTER — Ambulatory Visit: Payer: PRIVATE HEALTH INSURANCE

## 2018-03-24 DIAGNOSIS — Z23 Encounter for immunization: Secondary | ICD-10-CM | POA: Diagnosis not present

## 2018-03-24 NOTE — Progress Notes (Signed)
Nurse visit

## 2018-06-09 ENCOUNTER — Ambulatory Visit: Payer: Self-pay

## 2018-06-09 NOTE — Telephone Encounter (Signed)
Call placed to patient.  Left VM to call office

## 2018-06-09 NOTE — Telephone Encounter (Signed)
Message from Esaw Dace sent at 06/09/2018 11:35 AM EST   Summary: Exposure to Flu   Pt stated that her daughter has the flu and she would like to know if she needs to take something for herself as as precaution. Please advise CB# [phone]          Attempted to call pt x3. Messages left for pt to return call to office but no return call at this time.

## 2018-06-09 NOTE — Telephone Encounter (Signed)
Attempted to contact pt; left message for to call office with pt's sister-in-law, Cristin Penaflor, [phone].

## 2018-06-10 NOTE — Telephone Encounter (Signed)
Unable to reach patient, left a msg for patient to return call. When patient return call please inform patient that he need to to be establish care here at [facility] or see his PCP dr to get on a prophylactic mediation to protect him from getting the flu

## 2018-06-29 ENCOUNTER — Encounter: Payer: Self-pay | Admitting: Physician Assistant

## 2018-06-30 ENCOUNTER — Ambulatory Visit (INDEPENDENT_AMBULATORY_CARE_PROVIDER_SITE_OTHER): Payer: PRIVATE HEALTH INSURANCE | Admitting: Family Medicine

## 2018-06-30 ENCOUNTER — Other Ambulatory Visit: Payer: Self-pay

## 2018-06-30 ENCOUNTER — Encounter: Payer: Self-pay | Admitting: Family Medicine

## 2018-06-30 VITALS — BP 109/67 | HR 72 | Temp 98.3°F | Ht 63.5 in | Wt 109.3 lb

## 2018-06-30 DIAGNOSIS — Z23 Encounter for immunization: Secondary | ICD-10-CM | POA: Diagnosis not present

## 2018-06-30 DIAGNOSIS — Z1329 Encounter for screening for other suspected endocrine disorder: Secondary | ICD-10-CM | POA: Diagnosis not present

## 2018-06-30 DIAGNOSIS — Z13220 Encounter for screening for lipoid disorders: Secondary | ICD-10-CM

## 2018-06-30 DIAGNOSIS — Z130 Encounter for screening for diseases of the blood and blood-forming organs and certain disorders involving the immune mechanism: Secondary | ICD-10-CM

## 2018-06-30 DIAGNOSIS — Z13228 Encounter for screening for other metabolic disorders: Secondary | ICD-10-CM | POA: Diagnosis not present

## 2018-06-30 DIAGNOSIS — Z0000 Encounter for general adult medical examination without abnormal findings: Secondary | ICD-10-CM

## 2018-06-30 DIAGNOSIS — Z1211 Encounter for screening for malignant neoplasm of colon: Secondary | ICD-10-CM

## 2018-06-30 MED ORDER — ZOSTER VAC RECOMB ADJUVANTED 50 MCG/0.5ML IM SUSR: 0.5000 mL | Freq: Once | INTRAMUSCULAR | 1 refills | Status: AC

## 2018-06-30 NOTE — Patient Instructions (Addendum)
If you have lab work done today you will be contacted with your lab results within the next 2 weeks.  If you have not heard from [place] then please contact us. The fastest way to get your results is to register for My Chart.   IF you received an x-ray today, you will receive an invoice from [facility]. Please contact [facility] at [phone] with questions or concerns regarding your invoice.   IF you received labwork today, you will receive an invoice from [place]. Please contact [facility] at [phone] with questions or concerns regarding your invoice.   Our billing staff will not be able to assist you with questions regarding bills from these companies.  You will be contacted with the lab results as soon as they are available. The fastest way to get your results is to activate your My Chart account. Instructions are located on the last page of this paperwork. If you have not heard from [place] regarding the results in 2 weeks, please contact this office.     Ch?m [place] phng ng?a 40-64 tu?i, N? gi?i Preventive Care 40-64 Years, Female Gi?i thi?u Ch?m [place] phng ng?a ?? c?p ??n cc l?a ch?n l?i s?ng v th?m khm v?i chuyn gia ch?m [place] s?c kh?e c th? t?ng c??ng s?c kh?e v h?nh phc. Ch?m [place] phng ng?a bao g?m nh?ng g?   Khm s?c kh?e hng n?m. Vi?c ny c?ng ???c g?i l ki?m tra s?c kh?e hng n?m.  Khm nha khoa m?i n?m m?t ho?c hai l?n.  Khm m?t ??nh k?. Hy h?i chuyn gia ch?m [place] s?c kh?e v? vi?c bao lu th qu v? nn khm m?t m?t l?n.  Cc l?a ch?n l?i s?ng c nhn, bao g?m: ? Ch?m [place] r?ng v l?i hng ngy. ? Hoa?t ??ng th? ch?t th???ng xuyn. ? ?n ch? ?? ?n lnh m?nh. ? Hessie Diener s? d?ng thu?c l v ma ty. ? H?n ch? s? d?ng r??u. ? Quan h? tnh d?c an ton. ? Dng aspirin li?u th?p hng ngy b?t ??u ? tu?i 50. ? Dng th?c ph?m b? sung vitamin v ch?t khong theo khuy?n ngh? c?a chuyn gia ch?m [place] s?c kh?e. Nh?ng g x?y ra trong khi ki?m tra s?c kh?e  hng n?m? Cc d?ch v? v cc sng l?c do chuyn gia ch?m [place] s?c kh?e th?c hi?n trong khi khm s?c kh?e hng n?m s? ph? thu?c vo tu?i, s?c kh?e t?ng th?, cc y?u t? nguy c? t? l?i s?ng v b?nh s? gia ?nh c?a qu v?. T? v?n [place] gia ch?m  s?c kh?e c th? h?i qu v? v? vi?c:  S? d?ng r??u.  S? d?ng thu?c l.  S? d?ng ma ty.  Tnh tr?ng s?c kh?e tinh th?n.  Tnh tr?ng h?nh phc ? nh v trong m?i quan h?.  Quan h? tnh d?c.  Thi quen ?n u?ng.  Cng vi?c v mi tr??ng lm vi?c.  Ph??ng php ng?a [place].  Chu k? kinh nguy?t.  Ti?n s? mang thai. Sng l?c Qu v? c th? ???c lm cc ki?m tra ho?c cc php ?o sau:  Chi?u cao, cn n?ng v BMI (ch? s? kh?i c? th?).  Huy?t p.  N?ng ?? lipid v cholesterol. L??ng cc ch?t ny c th? ???c ki?m tra m?i 5 n?m m?t l?n ho?c th??ng xuyn h?n n?u qu v? trn [place].  Sng l?c ung th? ph?i. Qu v? c th? ???c lm sng l?c ny m?i n?m m?t  l?n b?t ??u ? tu?i 55 n?u qu v? c ti?n s? ht 30 bao thu?c m?i n?m v hi?n nay c ht thu?c ho?c ? b? thu?c trong vng 15 n?m tr??c.  Sng l?c ung th? ??i tr?c trng. T?t c? ng??i l?n nn sng l?c b?t ??u ? tu?i 50 v ti?p t?c cho ??n tu?i 75. Chuyn gia ch?m [place] s?c kh?e c?a qu v? c th? khuy?n ngh? sng l?c ? tu?i 45. Qu v? s? ???c lm cc ki?m tra 1-10 n?m m?t l?n, ty thu?c vo k?t qu? v lo?i ki?m tra sng l?c c?a qu v?. Nh?ng ng??i c nguy c? t?ng nn b?t ??u sng l?c ? ?? tu?i th?p h?n. Cc ki?m tra sng l?c c th? bao g?m: ? Xt nghi?m mu ?n trong phn d?a trn Guaiac. ? Xt nghi?m ha mi?n d?ch phn (FIT). ? Xt nghi?m ADN (axit ?xyribnuclic) trong phn. ? N?i soi ??i trng ?o. ? N?i soi ??i trng sigma. Trong ki?m tra ny, m?t ?ng m?m c m?t camera nh? (?ng n?i soi ??i trng sigma) ???c s? d?ng ?? ki?m tra tr?c trng v ph?n ??i trng d??i c?a qu v?. ?ng n?i soi ??i trng sigma ???c lu?n qua h?u mn vo tr?c trng v ph?n ??i trng d??i. ? N?i soi ??i trng. Trong khi  th?c hi?n ki?m tra ny, m?t ?ng m?m di, m?nh g?n m?t camera nh? (?ng n?i soi ??i trng) ???c s? d?ng ?? ki?m tra ton b? ??i trng v tr?c trng c?a qu v?.  Xt nghi?m mu tm vim gan C.  Xt nghi?m mu tm vim gan B.  Ki?m tra b?nh ly truy?n qua ???ng tnh d?c (STD).  Sng l?c ti?u ???ng. Xt nghi?m ny ???c th?c hi?n b?ng cch ki?m tra ???ng huy?t (glucose) sau khi qu v? khng ?n g trong m?t kho?ng th?i gian (nh?n ?i). Qu v? c th? ???c lm xt nghi?m ny m?i 1-3 n?m m?t l?n.  Ch?p x-quang tuy?n v. Ki?m tra ny c th? ???c th?c hi?n m?i 1-2 n?m m?t l?n. Hy trao ??i v?i chuyn gia ch?m [place] s?c kh?e v? vi?c khi no qu v? nn b?t ??u ch?p x-quang tuy?n v hng n?m. ?i?u ny c th? ph? thu?c vo vi?c qu v? c ti?n s? gia ?nh b? ung th? v hay khng.  Sng l?c ung th? lin quan ??n BRCA. Xt nghi?m ny c th? ???c th?c hi?n n?u qu v? c ti?n s? gia ?nh b? ung th? v, bu?ng tr?ng, ?ng d?n tr?ng ho?c ung th? phc m?c.  Khm vng ch?u v xt nghi?m ph?t t? bo c? t? cung (Pap). Nh?ng xt nghi?m ny c th? ???c th?c hi?n m?i 3 n?m m?t l?n b?t ??u t? tu?i 21. B?t ??u ? tu?i 30, xt nghi?m ny c th? ???c th?c hi?n m?i 5 n?m m?t l?n n?u qu v? ???c lm xt nghi?m Pap k?t h?p v?i xt nghi?m HPV.  Ch?p m?t ?? x??ng. Ki?m tra ny ???c th?c hi?n ?? sng l?c b?nh long x??ng. Qu v? c th? ???c ch?p m?t ?? x??ng n?u c nguy c? cao b? long x??ng. Hy th?o lu?n v?i chuyn gia ch?m [place] s?c kh?e cu?a qu v? v? cc k?t qu? xt nghi?m, cc ph??ng n ?i?u tr? v n?u c?n, nhu c?u lm thm cc ki?m tra. V?c xin Chu[place] gia ch?m [place] s?c kh?e c th? khuy?n ngh? m?t s? v?c xin nh?t ??nh, ch?ng h?n nh?:  V?c xin cm. V?c xin ny ???c  khuy?n ngh? hng n?m.  V?c xin u?n vn, b?ch h?u v ho g v bo (Tdap, Td). Qu v? c th? c?n m?t li?u Td (b?ch h?u, u?n vn) nh?c l?i m?i 10 n?m m?t l?n.  V?c xin th?y ??u. Qu v? c th? c?n v?c xin ny n?u ch?a ???c chch ng?a.  V?c xin zoster. Qu v? c th? c?n v?c  xin ny sau tu?i 60.  V?c xin s?i, quai b? v rubella (MMR). Qu v? c th? c?n t nh?t m?t li?u MMR n?u sinh ra vo n?m 1957 ho?c sau ?Sander Nephew v? c?ng c?n li?u th? hai.  V?c xin lin h?p ph? c?u khu?n 13 gi (PCV13). Qu v? c th? c?n v?c xin ny n?u c m?t s? tnh tr?ng b?nh l nh?t ??nh v tr??c ?y ch?a ???c chch ng?a.  V?c xin Ph? c?u khu?n polysaccharide (PPSV23). Qu v? c th? c?n m?t ho?c hai li?u n?u qu v? ht thu?c ho?c n?u qu v? c m?t s? tnh tr?ng b?nh l nh?t ??nh.  V?c xin vim mng no. Qu v? c th? c?n v?c xin ny n?u c cc tnh tr?ng nh?t ??nh.  V?c xin vim gan A. Qu v? c th? c?n v?c xin ny n?u c m?t s? tnh tr?ng nh?t ??nh ho?c n?u qu v? ?i du l?ch ho?c lm vi?c ? nh?ng n?i m qu v? c th? b? ph?i nhi?m vim gan A.  V?c xin vim gan B. Qu v? c th? c?n v?c xin ny n?u c m?t s? tnh tr?ng b?nh l nh?t ??nh ho?c n?u qu v? ?i du l?ch ho?c lm vi?c ? nh?ng n?i m qu v? c th? b? ph?i nhi?m vim gan B.  V?c xin Haemophilus influenzae tup b (Hib). Qu v? c th? c?n v?c xin ny n?u c cc tnh tr?ng nh?t ??nh. Hy trao ??i v?i chuyn gia ch?m [place] s?c kh?e v? nh?ng sng l?c v v?c xin no m qu v? c?n v bao lu th c?n cc sng l?c v v?c xin ? m?t l?n. Thng tin ny khng nh?m m?c ?ch thay th? cho l?i khuyn m chuyn gia ch?m [place] s?c kh?e ni v?i qu v?. Hy b?o ??m qu v? ph?i th?o lu?n b?t k? v?n ?? g m qu v? c v?i chuyn gia ch?m [place] s?c kh?e c?a qu v?. Document Released: 09/24/2016 Document Revised: 09/20/2017 Elsevier Interactive Patient Education  [employer].

## 2018-06-30 NOTE — Progress Notes (Signed)
1/9/202010:21 AM  Karlene Einstein 24-Sep-1965, 53 y.o. female [id number]  Chief Complaint  Patient presents with  . Establish Care    wants rx for shingle shot, not on any meds right now. Sometimes takes meds for stomach pain takes omeprazole when needed. received pap and mammogram at [facility], filled out consent for records    HPI:   Patient is a 53 y.o. female with past medical history significant for GERD who presents today for CPE  Cervical Cancer Screening: April 2017, normal Breast Cancer Screening: April 2019, normal Colorectal Cancer Screening: ordering today Bone Density Testing: at age 18 Seasonal Influenza Vaccination: oct 2019 Td/Tdap Vaccination: 2008 Pneumococcal Vaccination: at age 2 Zoster Vaccination: at pharmacy of choice Frequency of Dental evaluation: Q6 months Frequency of Eye evaluation: last year, no concerns  Fall Risk  06/30/2018  Falls in the past year? 0     Depression screen PHQ 2/9 06/30/2018  Decreased Interest 0  Down, Depressed, Hopeless 0  PHQ - 2 Score 0    No Known Allergies  Prior to Admission medications   Medication Sig Start Date End Date Taking? Authorizing Provider  omeprazole (PRILOSEC) 20 MG capsule Take 20 mg by mouth daily.   Yes [provider]    Past Medical History:  Diagnosis Date  . GERD (gastroesophageal reflux disease)     History reviewed. No pertinent surgical history.  Social History   Tobacco Use  . Smoking status: Never Smoker  . Smokeless tobacco: Never Used  Substance Use Topics  . Alcohol use: Never    Frequency: Never    Family History  Problem Relation Age of Onset  . Healthy Daughter     Review of Systems  Constitutional: Negative for chills and fever.  Respiratory: Negative for cough and shortness of breath.   Cardiovascular: Negative for chest pain, palpitations and leg swelling.  Gastrointestinal: Negative for abdominal pain, nausea and vomiting.  All other systems reviewed  and are negative.    OBJECTIVE:  Blood pressure 109/67, pulse 72, temperature 98.3 F (36.8 C), temperature source Oral, height 5' 3.5" (1.613 m), weight 109 lb 4.8 oz (49.6 kg), SpO2 98 %. Body mass index is 19.06 kg/m.   Physical Exam Vitals signs and nursing note reviewed.  Constitutional:      Appearance: She is well-developed.  HENT:     Head: Normocephalic and atraumatic.     Right Ear: Hearing, tympanic membrane, ear canal and external ear normal.     Left Ear: Hearing, tympanic membrane, ear canal and external ear normal.  Eyes:     Conjunctiva/sclera: Conjunctivae normal.     Pupils: Pupils are equal, round, and reactive to light.  Neck:     Musculoskeletal: Neck supple.     Thyroid: No thyromegaly.  Cardiovascular:     Rate and Rhythm: Normal rate and regular rhythm.     Heart sounds: Normal heart sounds. No murmur. No friction rub. No gallop.   Pulmonary:     Effort: Pulmonary effort is normal.     Breath sounds: Normal breath sounds. No wheezing or rales.  Abdominal:     General: Bowel sounds are normal. There is no distension.     Palpations: Abdomen is soft. There is no mass.     Tenderness: There is no abdominal tenderness.  Musculoskeletal: Normal range of motion.  Lymphadenopathy:     Cervical: No cervical adenopathy.  Skin:    General: Skin is warm and dry.  Neurological:  Mental Status: She is alert and oriented to person, place, and time.     Cranial Nerves: No cranial nerve deficit.     Gait: Gait normal.     Deep Tendon Reflexes: Reflexes are normal and symmetric.     ASSESSMENT and PLAN  1. Annual physical exam No concerns per history or exam. Routine HCM labs ordered. HCM reviewed/discussed. Anticipatory guidance regarding healthy weight, lifestyle and choices given.   2. Special screening for malignant neoplasms, colon - Cologuard  3. Encounter for screening for other metabolic disorders - KPT46+FKCL  4. Screening for thyroid  disorder - TSH  5. Screening for lipid disorders - Lipid panel  6. Screening for deficiency anemia - CBC     Return in about 3 months (around 09/29/2018) for pap.    Rutherford Guys, MD Primary Care at [place], [place] [address] Ph.  [phone] Fax [phone]

## 2018-07-01 LAB — CBC
Hematocrit: 42.6 % (ref 34.0–46.6)
Hemoglobin: 14.3 g/dL (ref 11.1–15.9)
MCH: 30.8 pg (ref 26.6–33.0)
MCHC: 33.6 g/dL (ref 31.5–35.7)
MCV: 92 fL (ref 79–97)
Platelets: 255 10*3/uL (ref 150–450)
RBC: 4.65 x10E6/uL (ref 3.77–5.28)
RDW: 12.1 % (ref 11.7–15.4)
WBC: 5.1 10*3/uL (ref 3.4–10.8)

## 2018-07-01 LAB — CMP14+EGFR
ALT: 20 IU/L (ref 0–32)
AST: 22 IU/L (ref 0–40)
Albumin/Globulin Ratio: 1.7 (ref 1.2–2.2)
Albumin: 4.8 g/dL (ref 3.5–5.5)
Alkaline Phosphatase: 93 IU/L (ref 39–117)
BUN/Creatinine Ratio: 23 (ref 9–23)
BUN: 11 mg/dL (ref 6–24)
Bilirubin Total: 0.6 mg/dL (ref 0.0–1.2)
CO2: 26 mmol/L (ref 20–29)
Calcium: 9.8 mg/dL (ref 8.7–10.2)
Chloride: 100 mmol/L (ref 96–106)
Creatinine, Ser: 0.48 mg/dL — ABNORMAL LOW (ref 0.57–1.00)
GFR calc Af Amer: 131 mL/min/{1.73_m2} (ref >59)
GFR calc non Af Amer: 114 mL/min/{1.73_m2} (ref >59)
Globulin, Total: 2.9 g/dL (ref 1.5–4.5)
Glucose: 84 mg/dL (ref 65–99)
Potassium: 4.4 mmol/L (ref 3.5–5.2)
Sodium: 141 mmol/L (ref 134–144)
Total Protein: 7.7 g/dL (ref 6.0–8.5)

## 2018-07-01 LAB — LIPID PANEL
Chol/HDL Ratio: 3.1 ratio (ref 0.0–4.4)
Cholesterol, Total: 220 mg/dL — ABNORMAL HIGH (ref 100–199)
HDL: 70 mg/dL (ref >39)
LDL Calculated: 128 mg/dL — ABNORMAL HIGH (ref 0–99)
Triglycerides: 111 mg/dL (ref 0–149)
VLDL Cholesterol Cal: 22 mg/dL (ref 5–40)

## 2018-07-01 LAB — TSH: TSH: 0.974 u[IU]/mL (ref 0.450–4.500)

## 2018-07-13 LAB — COLOGUARD: Cologuard: NEGATIVE

## 2018-09-29 ENCOUNTER — Ambulatory Visit: Payer: PRIVATE HEALTH INSURANCE | Admitting: Family Medicine

## 2019-03-23 ENCOUNTER — Encounter: Payer: Self-pay | Admitting: Family Medicine

## 2019-03-30 ENCOUNTER — Encounter: Payer: Self-pay | Admitting: Family Medicine

## 2019-03-30 ENCOUNTER — Other Ambulatory Visit: Payer: Self-pay

## 2019-03-30 ENCOUNTER — Ambulatory Visit: Payer: PRIVATE HEALTH INSURANCE | Admitting: Family Medicine

## 2019-03-30 VITALS — BP 108/74 | HR 75 | Temp 98.7°F | Ht 63.5 in | Wt 108.6 lb

## 2019-03-30 DIAGNOSIS — R0789 Other chest pain: Secondary | ICD-10-CM

## 2019-03-30 DIAGNOSIS — K219 Gastro-esophageal reflux disease without esophagitis: Secondary | ICD-10-CM | POA: Diagnosis not present

## 2019-03-30 MED ORDER — CYCLOBENZAPRINE HCL 5 MG PO TABS: 5.0000 mg | ORAL_TABLET | Freq: Three times a day (TID) | ORAL | 0 refills | Status: DC | PRN

## 2019-03-30 MED ORDER — OMEPRAZOLE 20 MG PO CPDR: 20.0000 mg | DELAYED_RELEASE_CAPSULE | Freq: Every day | ORAL | 1 refills | Status: DC

## 2019-03-30 MED ORDER — MELOXICAM 7.5 MG PO TABS: 7.5000 mg | ORAL_TABLET | Freq: Every day | ORAL | 0 refills | Status: DC

## 2019-03-30 NOTE — Progress Notes (Signed)
10/8/202010:48 AM  Jenny Mckenzie [birth year], 53 y.o., female [id number]  Chief Complaint  Patient presents with  . Pain    in right breast only for a few month now, did imaging last week, no evidence od abnormalty or cancer. Has image on her cell    HPI:   Patient is a 53 y.o. female who presents today for right breast pain  Patient reports for past several months intermittent burning.stinging pain in right breast area, radiates to axilla Denies any lumps, skin changes, nipple discharge, swollen glands Denies any inciting events Worse with certain movements Placing pressure helps Right handed Works at [employer]  Had mammogram done at [facility] on Mar 23 2019, negative, she does have dense breast  Requesting refill of omeprazole Takes for gerd, works very well for her  Depression screen [facility] 2/9 03/30/2019 06/30/2018  Decreased Interest 0 0  Down, Depressed, Hopeless 0 0  PHQ - 2 Score 0 0    Fall Risk  03/30/2019 06/30/2018  Falls in the past year? 0 0  Number falls in past yr: 0 -  Injury with Fall? 0 -     No Known Allergies  Prior to Admission medications   Medication Sig Start Date End Date Taking? Authorizing Provider  omeprazole (PRILOSEC) 20 MG capsule Take 20 mg by mouth daily.   Yes [provider]    Past Medical History:  Diagnosis Date  . GERD (gastroesophageal reflux disease)     No past surgical history on file.  Social History   Tobacco Use  . Smoking status: Never Smoker  . Smokeless tobacco: Never Used  Substance Use Topics  . Alcohol use: Never    Frequency: Never    Family History  Problem Relation Age of Onset  . Healthy Daughter     Review of Systems  Gastrointestinal: Negative for abdominal pain, melena, nausea and vomiting.   Per hpi  OBJECTIVE:  Today's Vitals   03/30/19 1026  BP: 108/74  Pulse: 75  Temp: 98.7 F (37.1 C)  SpO2: 96%  Weight: 108 lb 9.6 oz (49.3 kg)  Height: 5' 3.5" (1.613 m)   Body mass  index is 18.94 kg/m.   Physical Exam Vitals signs and nursing note reviewed. Exam conducted with a chaperone present.  Constitutional:      Appearance: She is well-developed.  HENT:     Head: Normocephalic and atraumatic.  Eyes:     General: No scleral icterus.    Conjunctiva/sclera: Conjunctivae normal.     Pupils: Pupils are equal, round, and reactive to light.  Neck:     Musculoskeletal: Neck supple.  Pulmonary:     Effort: Pulmonary effort is normal.  Chest:     Chest wall: Tenderness (TTP along right upper chest wall) present. No swelling or crepitus.     Breasts:        Right: No mass, nipple discharge, skin change or tenderness.        Left: No mass, nipple discharge, skin change or tenderness.  Lymphadenopathy:     Upper Body:     Right upper body: No supraclavicular, axillary or pectoral adenopathy.     Left upper body: No supraclavicular, axillary or pectoral adenopathy.  Skin:    General: Skin is warm and dry.  Neurological:     Mental Status: She is alert and oriented to person, place, and time.     No results found for this or any previous visit (from the past  24 hour(s)).  No results found.   ASSESSMENT and PLAN  1. Right-sided chest wall pain Discussed supportive measures, new meds r/se/b and RTC precautions.  2. Gastroesophageal reflux disease without esophagitis Controlled. Continue current regime.   Other orders - omeprazole (PRILOSEC) 20 MG capsule; Take 1 capsule (20 mg total) by mouth daily. - meloxicam (MOBIC) 7.5 MG tablet; Take 1 tablet (7.5 mg total) by mouth daily. - cyclobenzaprine (FLEXERIL) 5 MG tablet; Take 1 tablet (5 mg total) by mouth 3 (three) times daily as needed for muscle spasms.  Return if symptoms worsen or fail to improve.    Rutherford Guys, MD Primary Care at [place], [place] [address] Ph.  [phone] Fax [phone]

## 2019-03-30 NOTE — Patient Instructions (Signed)
° ° ° °  If you have lab work done today you will be contacted with your lab results within the next 2 weeks.  If you have not heard from us then please contact us. The fastest way to get your results is to register for My Chart. ° ° °IF you received an x-ray today, you will receive an invoice from [facility]. Please contact [facility] at [phone] with questions or concerns regarding your invoice.  ° °IF you received labwork today, you will receive an invoice from [facility]. Please contact [facility] at [phone] with questions or concerns regarding your invoice.  ° °Our billing staff will not be able to assist you with questions regarding bills from these companies. ° °You will be contacted with the lab results as soon as they are available. The fastest way to get your results is to activate your My Chart account. Instructions are located on the last page of this paperwork. If you have not heard from us regarding the results in 2 weeks, please contact this office. °  ° ° ° °

## 2019-04-26 ENCOUNTER — Other Ambulatory Visit: Payer: Self-pay | Admitting: Family Medicine

## 2019-08-28 ENCOUNTER — Ambulatory Visit: Payer: Self-pay | Attending: Internal Medicine

## 2019-08-28 DIAGNOSIS — Z23 Encounter for immunization: Secondary | ICD-10-CM | POA: Insufficient documentation

## 2019-08-28 NOTE — Progress Notes (Signed)
   Covid-19 Vaccination Clinic  Name:  Jenny Mckenzie    MRN: [id number] DOB: 04-26-1966  08/28/2019  Jenny Mckenzie was observed post Covid-19 immunization for 15 minutes without incident. She was provided with Vaccine Information Sheet and instruction to access the V-Safe system.   Jenny Mckenzie was instructed to call 911 with any severe reactions post vaccine: Marland Kitchen Difficulty breathing  . Swelling of face and throat  . A fast heartbeat  . A bad rash all over body  . Dizziness and weakness   Immunizations Administered    Name Date Dose VIS Date Route   Pfizer COVID-19 Vaccine 08/28/2019  8:40 AM 0.3 mL 06/02/2019 Intramuscular   Manufacturer: [place]   Lot: [id number]   [place]: [id number]

## 2019-09-27 ENCOUNTER — Ambulatory Visit: Payer: Self-pay | Attending: Internal Medicine

## 2019-09-27 DIAGNOSIS — Z23 Encounter for immunization: Secondary | ICD-10-CM

## 2019-09-27 NOTE — Progress Notes (Signed)
   Covid-19 Vaccination Clinic  Name:  Jenny Mckenzie    MRN: [id number] DOB: 02-Jan-1966  09/27/2019  [name] was observed post Covid-19 immunization for 15 minutes without incident. She was provided with Vaccine Information Sheet and instruction to access the V-Safe system.   [name] was instructed to call 911 with any severe reactions post vaccine: Marland Kitchen Difficulty breathing  . Swelling of face and throat  . A fast heartbeat  . A bad rash all over body  . Dizziness and weakness   Immunizations Administered    Name Date Dose VIS Date Route   Pfizer COVID-19 Vaccine 09/27/2019  8:47 AM 0.3 mL 06/02/2019 Intramuscular   Manufacturer: [place]   Lot: [id number]   [place]: [id number]

## 2020-02-20 ENCOUNTER — Other Ambulatory Visit: Payer: Self-pay

## 2020-02-20 ENCOUNTER — Encounter: Payer: Self-pay | Admitting: Family Medicine

## 2020-02-20 ENCOUNTER — Ambulatory Visit: Payer: PRIVATE HEALTH INSURANCE | Admitting: Family Medicine

## 2020-02-20 VITALS — BP 128/80 | HR 74 | Temp 98.4°F | Ht 65.6 in | Wt 106.4 lb

## 2020-02-20 DIAGNOSIS — K219 Gastro-esophageal reflux disease without esophagitis: Secondary | ICD-10-CM

## 2020-02-20 DIAGNOSIS — Z1211 Encounter for screening for malignant neoplasm of colon: Secondary | ICD-10-CM

## 2020-02-20 DIAGNOSIS — Z23 Encounter for immunization: Secondary | ICD-10-CM | POA: Diagnosis not present

## 2020-02-20 MED ORDER — OMEPRAZOLE 20 MG PO CPDR
20.0000 mg | DELAYED_RELEASE_CAPSULE | Freq: Every day | ORAL | 1 refills | Status: DC | PRN
Start: 2020-02-20 — End: 2021-08-07

## 2020-02-20 MED ORDER — FAMOTIDINE 40 MG PO TABS: 40.0000 mg | ORAL_TABLET | Freq: Every day | ORAL | 1 refills | Status: DC

## 2020-02-20 NOTE — Progress Notes (Signed)
8/31/202110:58 AM  Jenny Mckenzie 1966-03-20, 54 y.o., female [id number]  Chief Complaint  Patient presents with  . Medication Refill    omeprazole, says medication does work but sometimes feels she needs to take 2. Wants to know if this med is ok for long term use?    HPI:   Patient is a 54 y.o. female with past medical history significant for GERD who presents today for medication refill  Patient with long standing GERD issues Takes omeprazole 20mg  daily Will take extra dose if needed She has never had EGD She has not tried anything else Reports intermittent epigastric pain with mild nausea, no vomiting, bloating, decreased appetite, changes in BM or weight, melena or red blood in stool    Depression screen [facility] 2/9 03/30/2019 06/30/2018  Decreased Interest 0 0  Down, Depressed, Hopeless 0 0  PHQ - 2 Score 0 0    Fall Risk  02/20/2020 03/30/2019 06/30/2018  Falls in the past year? 0 0 0  Number falls in past yr: 0 0 -  Injury with Fall? 0 0 -     No Known Allergies  Prior to Admission medications   Medication Sig Start Date End Date Taking? Authorizing Provider  omeprazole (PRILOSEC) 20 MG capsule Take 1 capsule (20 mg total) by mouth daily. 03/30/19  Yes Rutherford Guys, MD    Past Medical History:  Diagnosis Date  . GERD (gastroesophageal reflux disease)     No past surgical history on file.  Social History   Tobacco Use  . Smoking status: Never Smoker  . Smokeless tobacco: Never Used  Substance Use Topics  . Alcohol use: Never    Family History  Problem Relation Age of Onset  . Healthy Daughter     ROS Per hpi  OBJECTIVE:  DTOIZ'T IWPYKD   02/20/20 1053  BP: 128/80  Pulse: 74  Temp: 98.4 F (36.9 C)  SpO2: 96%  Weight: 106 lb 6.4 oz (48.3 kg)  Height: 5' 5.6" (1.666 m)   Body mass index is 17.38 kg/m.   Physical Exam Vitals and nursing note reviewed.  Constitutional:      Appearance: She is well-developed.  HENT:     Head:  Normocephalic and atraumatic.     Mouth/Throat:     Pharynx: No oropharyngeal exudate.  Eyes:     General: No scleral icterus.    Extraocular Movements: Extraocular movements intact.     Conjunctiva/sclera: Conjunctivae normal.     Pupils: Pupils are equal, round, and reactive to light.  Cardiovascular:     Rate and Rhythm: Normal rate and regular rhythm.     Heart sounds: Normal heart sounds. No murmur heard.  No friction rub. No gallop.   Pulmonary:     Effort: Pulmonary effort is normal.     Breath sounds: Normal breath sounds. No wheezing, rhonchi or rales.  Abdominal:     General: Bowel sounds are normal. There is no distension.     Palpations: Abdomen is soft. There is no mass.     Tenderness: There is abdominal tenderness (epigastric). There is no guarding or rebound.  Musculoskeletal:     Cervical back: Neck supple.  Skin:    General: Skin is warm and dry.  Neurological:     Mental Status: She is alert and oriented to person, place, and time.     No results found for this or any previous visit (from the past 24 hour(s)).  No results found.  ASSESSMENT and PLAN  1. Gastroesophageal reflux disease without esophagitis Stable. No red flag ssx. Discussed concerns re long term use of PPI, trial of H2B with PPI for breakthrough. Discussed goal of pepcid 20mg  daily. Discussed dietary changes  2. Need for vaccination - Flu Vaccine QUAD 36+ mos IM  3. Special screening for malignant neoplasms, colon - Ambulatory referral to Gastroenterology  Other orders - famotidine (PEPCID) 40 MG tablet; Take 1 tablet (40 mg total) by mouth daily. - omeprazole (PRILOSEC) 20 MG capsule; Take 1 capsule (20 mg total) by mouth daily as needed (breakthrough heartburn).  Return for CPE with pap (before end of year).    Rutherford Guys, MD Primary Care at [place] [place], [place] [address] Ph.  [phone] Fax [phone]

## 2020-02-20 NOTE — Patient Instructions (Addendum)
If you have lab work done today you will be contacted with your lab results within the next 2 weeks.  If you have not heard from [place] then please contact us. The fastest way to get your results is to register for My Chart.   IF you received an x-ray today, you will receive an invoice from [facility]. Please contact [facility] at [phone] with questions or concerns regarding your invoice.   IF you received labwork today, you will receive an invoice from [facility]. Please contact [facility] at [phone] with questions or concerns regarding your invoice.   Our billing staff will not be able to assist you with questions regarding bills from these companies.  You will be contacted with the lab results as soon as they are available. The fastest way to get your results is to activate your My Chart account. Instructions are located on the last page of this paperwork. If you have not heard from [place] regarding the results in 2 weeks, please contact this office.     L?a Ch?n Th?c ?n cho B?nh Tro Ng??c D? Dy Th?c Qu?n, Ng??i L?n Food Choices for Gastroesophageal Reflux Disease, Adult Khi qu v? b? b?nh tro ng??c d? dy th?c qu?n (GERD), th?c ph?m qu v? ?n v thi quen ?n u?ng c?a qu v? l r?t quan tr?ng. L?a ch?n th?c ph?m ?ng c th? gip lm gi?m s? kh ch?u c?a b?nh tro ng??c d? dy th?c qu?n (GERD). Cn nh?c lm vi?c v?i m?t chuyn gia v? ch? ?? ?n v dinh d??ng (chuyn gia dinh d??ng) nh?m gip qu v? l?a ch?n nh?ng th?c ph?m t?t cho s?c kh?e. Nh?ng h??ng d?n chung no ti ph?i tun theo?  K? ho?ch ?n u?ng  Ch?n nh?ng th?c ph?m t?t cho s?c kh?e t ch?t bo, ch?ng h?n nh? tri cy, rau c?, ng? c?c nguyn h?t, cc s?n ph?m t? s?a t bo, v th?t n?c, c, v th?t gia c?m.  ?n cc b?a nh?, th??ng xuyn thay v ba b?a l?n m?i ngy. ?n ch?m ri, trong khng gian th? gin. Trnh g?p ng??i ho?c n?m xu?ng cho ??n khi ?n xong ???c 2-3 gi?.  H?n ch? cc th?c ph?m giu ch?t bo  ch?ng h?n nh? th?t nhi?u m? ho?c th?c ph?m chin.  Gi?i h?n l??ng tiu th? d?u, b?, v ch?t bo t? d?u th?c v?t ? m?c d??i 8 mu?ng c ph m?i ngy.  Tra?nh nh??ng th?? sau: ? Cc th?c ph?m gy ra cc tri?u ch?ng. Nh?ng tri?u ch?ng ny c th? khc nhau ??i v?i nh?ng ng??i khc nhau. Hy ghi nh?t k th?c ph?m ?? theo di nh?ng th?c ph?m no gy ra cc tri?u ch?ng. ? R??u. ? U?ng nhi?u ch?t l?ng khi ?n. ? ?n trong kho?ng 2-3 gi? tr??c khi ?i ng?.  N?u ?n b?ng cc ph??ng php khc thay vi? chin. Ph??ng php ny c th? bao g?m b? l, n??ng, ho?c hun nng. L?i s?ng  Duy tr m??c cn n?ng c l?i cho s?c kh?e. H?i chuyn gia ch?m [place] s?c kh?e c?a quy? vi? xem m??c cn n??ng na?o la? t?t cho quy? vi?. N?u qu v? c?n gi?m cn, hy trao ??i v?i chuyn gia ch?m [place] s?c kh?e c?a qu v? ?? gi?m cn m?t cch an ton.  T?p th? d?c t?i thi?u 30 pht t? 5 ngy tr? ln m?i tu?n, ho?c theo ch? d?n c?a chuyn gia ch?m [place] s?c kh?e.  Trnh m?c qu?n o b ch?t quanh  th?t l?ng v ng?c.  Khng s? d?ng b?t k? s?n ph?m no ch?a nicotine ho?c thu?c l, ch?ng ha?n nh? thu?c l d?ng ht v thu?c l ?i?n t?. N?u qu v? c?n gip ?? ?? cai thu?c, hy h?i chuyn gia ch?m [place] s?c kh?e.  Ng? v?i ??u gi??ng k cao. S? d?ng chm d??i n?m ho?c t?m k d??i khung gi??ng ?? nng ??u gi??ng ln. Nh?ng lo?i th?c ?n no khng ???c khuyn dng? Nh?ng m?c ???c li?t k c th? khng ph?i danh sch ??y ??. Hy trao ??i v?i bc s? chuyn khoa dinh d??ng xem cc l?a ch?n ch? ?? dinh d??ng no ph h?p nh?t v?i qu v?. Ng? c?c Bnh ng?t ho?c bnh m n??ng nhanh b? sung ch?t bo. Bnh m n??ng c?a Php. Rau c? Marlou Starks c? chin ng?p d?u. Khoai ty chin. M?i lo?i rau c? ???c ch? bi?n km thm ch?t bo. M?i lo?i rau c? gy ra cc tri?u ch?ng. ??i v?i m?t s? ng??i, nh?ng lo?i rau c? ny c th? bao g?m c chua v cc s?n ph?m t? c chua, ?t, hnh v t?i, v c?i ng?a. Tri cy M?i lo?i tri cy ???c ch? bi?n km thm ch?t bo. M?i lo?i tri cy c  th? gy ra cc tri?u ch?ng. ??i v?i m?t s? ng??i, nh?ng lo?i tri cy ny c th? bao g?m tri cy h? cam qut, ch?ng h?n nh? cam, b??i, qu? d?a, v chanh. Th?t v cc th?c ph?m ch?a protein khc Th?t giu ch?t bo, ch?ng h?n nh? th?t l?n ho?c th?t b nhi?u m?, xc xch, s??n, gi?m bng, l?p x??ng, [place] v th?t l?n mu?i xng khi. Th?t chin ho?c protein, bao g?m c chin v th?t g chin. Qu? h?ch v b? h?t. S?a. S?a nguyn kem v s?a s-c-la. Kem chuaSalome Spotted. Kem. Pho mt kem. S?a l?c. ?? u?ng C ph v tr, c ho?c khng c caffeine. ?? u?ng c ga. Soda. ?? u?ng t?ng l?c. N??c p tri cy lm t? tri cy chua (ch?ng h?n nh? cam ho?c b??i). N??c p c chua. ?? u?ng ch?a c?n. M? v d?u B?. B? th?c v?t. Ch?t bo t? d?u th?c v?t. B? s??a tru. K?o v ?? trng mi?ng S-c-la v c ca. Bnh rnCassell Smiles v? v cc th?c ph?m khc. H?t tiu. B?c h v b?c h l?c. M?i lo?i gia v?, th?o d??c, ho?c gia v? gy ra cc tri?u ch?ng. ??i v?i m?t s? ng??i, gia v? v cc th?c ph?m khc c th? bao g?m c ri, n??c s?t nng, ho?c d?u d?m ?? tr?n salad. Tm t?t  Khi qu v? b? b?nh tro ng??c d? dy th?c qu?n (GERD), cc l?a ch?n th?c ph?m v l?i s?ng c vai tr r?t quan tr?ng ?? gip lm gi?m s? kh ch?u c?a GERD.  ?n cc b?a nh?, th??ng xuyn thay v ba b?a l?n m?i ngy. ?n ch?m ri, trong khng gian th? gin. Trnh g?p ng??i ho?c n?m xu?ng cho ??n khi ?n xong ???c 2-3 gi?.  H?n ch? cc th?c ph?m giu ch?t bo, ch?ng h?n nh? th?t m? ho?c th?c ph?m chin. Thng tin ny khng nh?m m?c ?ch thay th? cho l?i khuyn m chuyn gia ch?m [place] s?c kh?e ni v?i qu v?. Hy b?o ??m qu v? ph?i th?o lu?n b?t k? v?n ?? g m qu v? c v?i chuyn gia ch?m [place] s?c kh?e c?a qu v?. Document Revised: 09/25/2016 Elsevier Patient Education  [employer].

## 2020-02-28 ENCOUNTER — Encounter: Payer: Self-pay | Admitting: Gastroenterology

## 2020-04-11 ENCOUNTER — Encounter: Payer: PRIVATE HEALTH INSURANCE | Admitting: Family Medicine

## 2020-04-23 ENCOUNTER — Ambulatory Visit (AMBULATORY_SURGERY_CENTER): Payer: Self-pay | Admitting: *Deleted

## 2020-04-23 ENCOUNTER — Other Ambulatory Visit: Payer: Self-pay

## 2020-04-23 VITALS — Ht 65.5 in | Wt 106.0 lb

## 2020-04-23 DIAGNOSIS — Z1211 Encounter for screening for malignant neoplasm of colon: Secondary | ICD-10-CM

## 2020-04-23 MED ORDER — SUTAB 1479-225-188 MG PO TABS: 1.0000 | ORAL_TABLET | Freq: Once | ORAL | 0 refills | Status: AC

## 2020-04-23 NOTE — Progress Notes (Signed)

## 2020-04-25 ENCOUNTER — Telehealth: Payer: Self-pay | Admitting: Gastroenterology

## 2020-04-29 ENCOUNTER — Encounter: Payer: Self-pay | Admitting: Gastroenterology

## 2020-04-30 NOTE — Telephone Encounter (Signed)
States in PV note 11-3 Sutab coupon given to pt - should make tabs $40 - daughter states she has the coupon but needs 2 kits so will be $80- instructed daughter there are 24 tabs in 1 kit, she ONLY needs one kit for $40- daughter verbalized understanding- will take coupon to pharmacy  

## 2020-05-06 ENCOUNTER — Encounter: Payer: Self-pay | Admitting: Certified Registered Nurse Anesthetist

## 2020-05-07 ENCOUNTER — Encounter: Payer: Self-pay | Admitting: Gastroenterology

## 2020-05-07 ENCOUNTER — Other Ambulatory Visit: Payer: Self-pay

## 2020-05-07 ENCOUNTER — Ambulatory Visit (AMBULATORY_SURGERY_CENTER): Payer: PRIVATE HEALTH INSURANCE | Admitting: Gastroenterology

## 2020-05-07 VITALS — BP 114/76 | HR 58 | Temp 96.6°F | Resp 16 | Ht 65.5 in | Wt 106.0 lb

## 2020-05-07 DIAGNOSIS — Z1211 Encounter for screening for malignant neoplasm of colon: Secondary | ICD-10-CM | POA: Diagnosis not present

## 2020-05-07 DIAGNOSIS — D127 Benign neoplasm of rectosigmoid junction: Secondary | ICD-10-CM

## 2020-05-07 DIAGNOSIS — K635 Polyp of colon: Secondary | ICD-10-CM | POA: Diagnosis not present

## 2020-05-07 MED ORDER — SODIUM CHLORIDE 0.9 % IV SOLN: 500.0000 mL | Freq: Once | INTRAVENOUS | Status: DC

## 2020-05-07 NOTE — Patient Instructions (Signed)
HANDOUTS PROVIDED ON: polyps and hemorrhoids  The polyps removed today have been sent for pathology.  The results can take 1-3 weeks to receive.  When your next colonoscopy should occur will be based on the pathology results.    You may resume your previous diet and medication schedule.  Thank you for allowing [place] to care for you today!!!     YOU HAD AN ENDOSCOPIC PROCEDURE TODAY AT [facility]:   Refer to the procedure report that was given to you for any specific questions about what was found during the examination.  If the procedure report does not answer your questions, please call your gastroenterologist to clarify.  If you requested that your care partner not be given the details of your procedure findings, then the procedure report has been included in a sealed envelope for you to review at your convenience later.  YOU SHOULD EXPECT: Some feelings of bloating in the abdomen. Passage of more gas than usual.  Walking can help get rid of the air that was put into your GI tract during the procedure and reduce the bloating. If you had a lower endoscopy (such as a colonoscopy or flexible sigmoidoscopy) you may notice spotting of blood in your stool or on the toilet paper. If you underwent a bowel prep for your procedure, you may not have a normal bowel movement for a few days.  Please Note:  You might notice some irritation and congestion in your nose or some drainage.  This is from the oxygen used during your procedure.  There is no need for concern and it should clear up in a day or so.  SYMPTOMS TO REPORT IMMEDIATELY:   Following lower endoscopy (colonoscopy or flexible sigmoidoscopy):  Excessive amounts of blood in the stool  Significant tenderness or worsening of abdominal pains  Swelling of the abdomen that is new, acute  Fever of 100F or higher   For urgent or emergent issues, a gastroenterologist can be reached at any hour by calling [phone]. Do not use  MyChart messaging for urgent concerns.    DIET:  We do recommend a small meal at first, but then you may proceed to your regular diet.  Drink plenty of fluids but you should avoid alcoholic beverages for 24 hours.  ACTIVITY:  You should plan to take it easy for the rest of today and you should NOT DRIVE or use heavy machinery until tomorrow (because of the sedation medicines used during the test).    FOLLOW UP: Our staff will call the number listed on your records 48-72 hours following your procedure to check on you and address any questions or concerns that you may have regarding the information given to you following your procedure. If we do not reach you, we will leave a message.  We will attempt to reach you two times.  During this call, we will ask if you have developed any symptoms of COVID 19. If you develop any symptoms (ie: fever, flu-like symptoms, shortness of breath, cough etc.) before then, please call [phone].  If you test positive for Covid 19 in the 2 weeks post procedure, please call and report this information to [place].    If any biopsies were taken you will be contacted by phone or by letter within the next 1-3 weeks.  Please call us at [phone] if you have not heard about the biopsies in 3 weeks.    SIGNATURES/CONFIDENTIALITY: You and/or your care partner have signed paperwork  which will be entered into your electronic medical record.  These signatures attest to the fact that that the information above on your After Visit Summary has been reviewed and is understood.  Full responsibility of the confidentiality of this discharge information lies with you and/or your care-partner.

## 2020-05-07 NOTE — Op Note (Signed)
[facility] Patient Name: Jenny Mckenzie Procedure Date: 05/07/2020 10:42 AM MRN: [id number] Endoscopist: Mauri Pole , MD Age: 54 Referring MD:  Date of Birth: 12/11/1965 Gender: Female Account #: [id number] Procedure:                Colonoscopy Indications:              Screening in patient at increased risk: Family                            history of 1st-degree relative with colorectal                            cancer Medicines:                Monitored Anesthesia Care Procedure:                Pre-Anesthesia Assessment:                           - Prior to the procedure, a History and Physical                            was performed, and patient medications and                            allergies were reviewed. The patient's tolerance of                            previous anesthesia was also reviewed. The risks                            and benefits of the procedure and the sedation                            options and risks were discussed with the patient.                            All questions were answered, and informed consent                            was obtained. Prior Anticoagulants: The patient has                            taken no previous anticoagulant or antiplatelet                            agents. ASA Grade Assessment: II - A patient with                            mild systemic disease. After reviewing the risks                            and benefits, the patient was deemed in  satisfactory condition to undergo the procedure.                           After obtaining informed consent, the colonoscope                            was passed under direct vision. Throughout the                            procedure, the patient's blood pressure, pulse, and                            oxygen saturations were monitored continuously. The                            Colonoscope was introduced through the anus and                             advanced to the the cecum, identified by                            appendiceal orifice and ileocecal valve. The                            colonoscopy was performed without difficulty. The                            patient tolerated the procedure well. The quality                            of the bowel preparation was adequate. The                            ileocecal valve, appendiceal orifice, and rectum                            were photographed. Scope In: 10:50:01 AM Scope Out: 11:00:50 AM Scope Withdrawal Time: 0 hours 8 minutes 8 seconds  Total Procedure Duration: 0 hours 10 minutes 49 seconds  Findings:                 The perianal and digital rectal examinations were                            normal.                           Three sessile polyps were found in the                            recto-sigmoid colon. The polyps were 1 to 2 mm in                            size. These polyps were removed with a cold biopsy  forceps. Resection and retrieval were complete.                           Non-bleeding internal hemorrhoids were found during                            retroflexion. The hemorrhoids were small.                           The exam was otherwise without abnormality. Complications:            No immediate complications. Estimated Blood Loss:     Estimated blood loss was minimal. Impression:               - Three 1 to 2 mm polyps at the recto-sigmoid                            colon, removed with a cold biopsy forceps. Resected                            and retrieved.                           - Non-bleeding internal hemorrhoids.                           - The examination was otherwise normal. Recommendation:           - Patient has a contact number available for                            emergencies. The signs and symptoms of potential                            delayed complications were discussed with the                             patient. Return to normal activities tomorrow.                            Written discharge instructions were provided to the                            patient.                           - Resume previous diet.                           - Continue present medications.                           - Await pathology results.                           - Repeat colonoscopy in 5 years for surveillance  based on pathology results. Mauri Pole, MD 05/07/2020 11:04:07 AM This report has been signed electronically.

## 2020-05-07 NOTE — Progress Notes (Signed)
AR - Check-in  [place] - VS  Pt's states no medical or surgical changes since previsit or office visit.

## 2020-05-07 NOTE — Progress Notes (Signed)
Called to room to assist during endoscopic procedure.  Patient ID and intended procedure confirmed with present staff. Received instructions for my participation in the procedure from the performing physician.  

## 2020-05-07 NOTE — Progress Notes (Signed)
Report given to PACU, vss 

## 2020-05-09 ENCOUNTER — Telehealth: Payer: Self-pay | Admitting: *Deleted

## 2020-05-09 NOTE — Telephone Encounter (Signed)
  Follow up Call-  Call back number 05/07/2020  Post procedure Call Back phone  # [phone] cell Husband phone - [name] (he speaks english)  Permission to leave phone message Yes  Some recent data might be hidden     Patient questions:  Do you have a fever, pain , or abdominal swelling? No. Pain Score  0 *  Have you tolerated food without any problems? Yes.    Have you been able to return to your normal activities? Yes.    Do you have any questions about your discharge instructions: Diet   No. Medications  No. Follow up visit  No.  Do you have questions or concerns about your Care? No.  Actions: * If pain score is 4 or above: No action needed, pain <4.  1. Have you developed a fever since your procedure? no  2.   Have you had an respiratory symptoms (SOB or cough) since your procedure? no  3.   Have you tested positive for COVID 19 since your procedure no  4.   Have you had any family members/close contacts diagnosed with the COVID 19 since your procedure?  no   If yes to any of these questions please route to Joylene John, RN and Joella Prince, RN

## 2020-05-27 ENCOUNTER — Encounter: Payer: Self-pay | Admitting: Gastroenterology

## 2020-08-22 ENCOUNTER — Other Ambulatory Visit: Payer: Self-pay

## 2020-08-22 DIAGNOSIS — K219 Gastro-esophageal reflux disease without esophagitis: Secondary | ICD-10-CM

## 2020-08-22 MED ORDER — FAMOTIDINE 40 MG PO TABS: 40.0000 mg | ORAL_TABLET | Freq: Every day | ORAL | 1 refills | Status: DC

## 2021-08-07 ENCOUNTER — Ambulatory Visit (INDEPENDENT_AMBULATORY_CARE_PROVIDER_SITE_OTHER): Payer: PRIVATE HEALTH INSURANCE | Admitting: Gastroenterology

## 2021-08-07 ENCOUNTER — Encounter: Payer: Self-pay | Admitting: Gastroenterology

## 2021-08-07 VITALS — BP 84/60 | HR 76 | Ht 62.25 in | Wt 106.1 lb

## 2021-08-07 DIAGNOSIS — K581 Irritable bowel syndrome with constipation: Secondary | ICD-10-CM | POA: Diagnosis not present

## 2021-08-07 DIAGNOSIS — R1013 Epigastric pain: Secondary | ICD-10-CM | POA: Diagnosis not present

## 2021-08-07 NOTE — Patient Instructions (Addendum)
You have been scheduled for an RUQ abdominal ultrasound at [facility] Radiology (1st floor of hospital) on _______ at ________. Please arrive 15 minutes prior to your appointment for registration. Make certain not to have anything to eat or drink 6 hours prior to your appointment. Should you need to reschedule your appointment, please contact radiology at [phone]. This test typically takes about 30 minutes to perform.   You will be contacted by [facility] in the next 2 days to arrange a Ultrasound.  The number on your caller ID will be [phone], please answer when they call.  If you have not heard from them in 2 days please call [phone] to schedule.     Use Miralax 1 capful daily  You have been scheduled for an endoscopy. Please follow written instructions given to you at your visit today. If you use inhalers (even only as needed), please bring them with you on the day of your procedure.   Follow up in 3 months  Due to recent changes in healthcare laws, you may see the results of your imaging and laboratory studies on MyChart before your provider has had a chance to review them.  We understand that in some cases there may be results that are confusing or concerning to you. Not all laboratory results come back in the same time frame and the provider may be waiting for multiple results in order to interpret others.  Please give [place] 48 hours in order for your provider to thoroughly review all the results before contacting the office for clarification of your results.    I appreciate the  opportunity to care for you  Thank You   Harl Bowie , MD

## 2021-08-07 NOTE — Progress Notes (Signed)
Jenny Mckenzie    [id number]    [birth year]  Primary Care Physician:Santiago Claudie Leach, MD  Referring Physician: Jacelyn Pi, Lilia Argue, MD [place],  [place] [address]   Chief complaint: Abdominal pain  HPI: 56 year old very pleasant female here for follow-up visit with complaints of epigastric abdominal pain.  She started having epigastric abdominal pain sometime around October 2022, she has discomfort when she does not eat anything, worse in the morning when she wakes up and sometimes middle of the night.  She feels hungry all the time.  She does notice some improvement after meals.  She was started on pantoprazole, has not noticed any significant improvement.  She has significant abdominal bloating.  Also has alternating constipation and diarrhea intermittently. Denies any rectal bleeding or melena.  Colonoscopy 05/07/20 - Three 1 to 2 mm polyps [hyperplastic] at the recto-sigmoid colon, removed with a cold biopsy forceps. Resected and retrieved. - Non-bleeding internal hemorrhoids. - The examination was otherwise normal.  Outpatient Encounter Medications as of 08/07/2021  Medication Sig   famotidine (PEPCID) 40 MG tablet Take 20 mg by mouth 2 (two) times daily.   pantoprazole (PROTONIX) 40 MG tablet Take 40 mg by mouth daily.   [DISCONTINUED] famotidine (PEPCID) 40 MG tablet Take 1 tablet (40 mg total) by mouth daily.   [DISCONTINUED] omeprazole (PRILOSEC) 20 MG capsule Take 1 capsule (20 mg total) by mouth daily as needed (breakthrough heartburn).   Facility-Administered Encounter Medications as of 08/07/2021  Medication   0.9 %  sodium chloride infusion    Allergies as of 08/07/2021   (No Known Allergies)    Past Medical History:  Diagnosis Date   GERD (gastroesophageal reflux disease)     Past Surgical History:  Procedure Laterality Date   CESAREAN SECTION     x 2    Family History  Problem Relation Age of Onset   Colon  cancer Mother    Healthy Daughter    Esophageal cancer Neg Hx    Stomach cancer Neg Hx    Rectal cancer Neg Hx     Social History   Socioeconomic History   Marital status: Married    Spouse name: Not on file   Number of children: Not on file   Years of education: Not on file   Highest education level: Not on file  Occupational History   Not on file  Tobacco Use   Smoking status: Never   Smokeless tobacco: Never  Vaping Use   Vaping Use: Never used  Substance and Sexual Activity   Alcohol use: Never   Drug use: Never   Sexual activity: Not on file  Other Topics Concern   Not on file  Social History Narrative   Not on file   Social Determinants of Health   Financial Resource Strain: Not on file  Food Insecurity: Not on file  Transportation Needs: Not on file  Physical Activity: Not on file  Stress: Not on file  Social Connections: Not on file  Intimate Partner Violence: Not on file      Review of systems: All other review of systems negative except as mentioned in the HPI.   Physical Exam: Vitals:   08/07/21 0957  BP: (!) 84/60  Pulse: 76   Body mass index is 19.25 kg/m. Gen:      No acute distress HEENT:  sclera anicteric Abd:  soft, non-tender; no palpable masses, no distension Ext:    No edema Neuro: alert and oriented x 3 Psych: normal mood and affect  Data Reviewed:  Reviewed labs, radiology imaging, old records and pertinent past GI work up   Assessment and Plan/Recommendations:  56 year old very pleasant female with complaints of epigastric abdominal pain  Schedule for EGD for further evaluation, will need to exclude erosive gastritis, H. pylori related dyspepsia or gastroduodenal ulcer The risks and benefits as well as alternatives of endoscopic procedure(s) have been discussed and reviewed. All questions answered. The patient agrees to proceed.  GERD: Use pantoprazole 40 mg daily and antireflux measures  Obtain right upper  quadrant ultrasound to exclude cholecystitis or cholelithiasis  IBS constipation : Use MiraLAX 1 capful daily as needed Increase dietary fiber and water intake  Return in 3 months  This visit required 40 minutes of patient care (this includes precharting, chart review, review of results, face-to-face time used for counseling as well as treatment plan and follow-up. The patient was provided an opportunity to ask questions and all were answered. The patient agreed with the plan and demonstrated an understanding of the instructions.  Jenny Mckenzie , MD    CC: Jacelyn Pi, Lilia Argue, *

## 2021-08-12 ENCOUNTER — Encounter: Payer: Self-pay | Admitting: Gastroenterology

## 2021-08-18 ENCOUNTER — Ambulatory Visit (HOSPITAL_COMMUNITY)
Admission: RE | Admit: 2021-08-18 | Discharge: 2021-08-18 | Disposition: A | Discharge status: Home / Self Care | Payer: PRIVATE HEALTH INSURANCE | Source: Ambulatory Visit | Attending: Gastroenterology | Admitting: Gastroenterology

## 2021-08-18 ENCOUNTER — Other Ambulatory Visit: Payer: Self-pay

## 2021-08-18 DIAGNOSIS — R1013 Epigastric pain: Secondary | ICD-10-CM | POA: Diagnosis not present

## 2021-08-18 DIAGNOSIS — K581 Irritable bowel syndrome with constipation: Secondary | ICD-10-CM | POA: Insufficient documentation

## 2021-09-02 ENCOUNTER — Encounter: Payer: Self-pay | Admitting: Certified Registered Nurse Anesthetist

## 2021-09-09 ENCOUNTER — Encounter: Payer: PRIVATE HEALTH INSURANCE | Admitting: Gastroenterology

## 2021-09-10 ENCOUNTER — Ambulatory Visit (AMBULATORY_SURGERY_CENTER): Payer: PRIVATE HEALTH INSURANCE | Admitting: Gastroenterology

## 2021-09-10 ENCOUNTER — Encounter: Payer: Self-pay | Admitting: Gastroenterology

## 2021-09-10 ENCOUNTER — Other Ambulatory Visit: Payer: Self-pay

## 2021-09-10 VITALS — BP 105/60 | HR 60 | Temp 97.8°F | Resp 13 | Ht 62.0 in | Wt 106.0 lb

## 2021-09-10 DIAGNOSIS — B9681 Helicobacter pylori [H. pylori] as the cause of diseases classified elsewhere: Secondary | ICD-10-CM

## 2021-09-10 DIAGNOSIS — K2970 Gastritis, unspecified, without bleeding: Secondary | ICD-10-CM

## 2021-09-10 DIAGNOSIS — K449 Diaphragmatic hernia without obstruction or gangrene: Secondary | ICD-10-CM

## 2021-09-10 DIAGNOSIS — K2950 Unspecified chronic gastritis without bleeding: Secondary | ICD-10-CM | POA: Diagnosis not present

## 2021-09-10 DIAGNOSIS — R1013 Epigastric pain: Secondary | ICD-10-CM

## 2021-09-10 MED ORDER — SODIUM CHLORIDE 0.9 % IV SOLN: 500.0000 mL | Freq: Once | INTRAVENOUS | Status: DC

## 2021-09-10 NOTE — Progress Notes (Signed)
Called to room to assist during endoscopic procedure.  Patient ID and intended procedure confirmed with present staff. Received instructions for my participation in the procedure from the performing physician.  

## 2021-09-10 NOTE — Progress Notes (Signed)
0945 Robinul 0.1 mg IV given due large amount of secretions upon assessment.  MD made aware, vss 

## 2021-09-10 NOTE — Patient Instructions (Signed)
Please read handouts provided. ?Continue present medications. ?Await pathology results. ?Follow an antireflux regimen. ?Return to GI office at the next available appointment in 1-2 months. ? ? ?YOU HAD AN ENDOSCOPIC PROCEDURE TODAY AT [facility]:   Refer to the procedure report that was given to you for any specific questions about what was found during the examination.  If the procedure report does not answer your questions, please call your gastroenterologist to clarify.  If you requested that your care partner not be given the details of your procedure findings, then the procedure report has been included in a sealed envelope for you to review at your convenience later. ? ?YOU SHOULD EXPECT: Some feelings of bloating in the abdomen. Passage of more gas than usual.  Walking can help get rid of the air that was put into your GI tract during the procedure and reduce the bloating. If you had a lower endoscopy (such as a colonoscopy or flexible sigmoidoscopy) you may notice spotting of blood in your stool or on the toilet paper. If you underwent a bowel prep for your procedure, you may not have a normal bowel movement for a few days. ? ?Please Note:  You might notice some irritation and congestion in your nose or some drainage.  This is from the oxygen used during your procedure.  There is no need for concern and it should clear up in a day or so. ? ?SYMPTOMS TO REPORT IMMEDIATELY: ? ? ? ?Following upper endoscopy (EGD) ? Vomiting of blood or coffee ground material ? New chest pain or pain under the shoulder blades ? Painful or persistently difficult swallowing ? New shortness of breath ? Fever of 100?F or higher ? Black, tarry-looking stools ? ?For urgent or emergent issues, a gastroenterologist can be reached at any hour by calling [phone]. ?Do not use MyChart messaging for urgent concerns.  ? ? ?DIET:  We do recommend a small meal at first, but then you may proceed to your regular diet.   Drink plenty of fluids but you should avoid alcoholic beverages for 24 hours. ? ?ACTIVITY:  You should plan to take it easy for the rest of today and you should NOT DRIVE or use heavy machinery until tomorrow (because of the sedation medicines used during the test).   ? ?FOLLOW UP: ?Our staff will call the number listed on your records 48-72 hours following your procedure to check on you and address any questions or concerns that you may have regarding the information given to you following your procedure. If we do not reach you, we will leave a message.  We will attempt to reach you two times.  During this call, we will ask if you have developed any symptoms of COVID 19. If you develop any symptoms (ie: fever, flu-like symptoms, shortness of breath, cough etc.) before then, please call [phone].  If you test positive for Covid 19 in the 2 weeks post procedure, please call and report this information to [place].   ? ?If any biopsies were taken you will be contacted by phone or by letter within the next 1-3 weeks.  Please call us at [phone] if you have not heard about the biopsies in 3 weeks.  ? ? ?SIGNATURES/CONFIDENTIALITY: ?You and/or your care partner have signed paperwork which will be entered into your electronic medical record.  These signatures attest to the fact that that the information above on your After Visit Summary has been reviewed and is understood.  Full responsibility of the confidentiality of this discharge information lies with you and/or your care-partner.  ?

## 2021-09-10 NOTE — Progress Notes (Signed)
Report given to PACU, vss 

## 2021-09-10 NOTE — Progress Notes (Signed)
Pt's states no medical or surgical changes since previsit or office visit. 

## 2021-09-10 NOTE — Progress Notes (Signed)
[facility] History and Physical ? ? ?Primary Care Physician:  Myrtie Neither, PA-C ? ? ?Reason for Procedure:  Epigastric pain ? ?Plan:    EGD with possible interventions as needed ? ? ? ? ?HPI: Jenny Mckenzie is a very pleasant 56 y.o. female here for EGD for evaluation of epigastric pain. ? ?Please refer to office visit note 08/07/21 for details ? ?The risks and benefits as well as alternatives of endoscopic procedure(s) have been discussed and reviewed. All questions answered. The patient agrees to proceed. ? ? ? ?Past Medical History:  ?Diagnosis Date  ? Anxiety   ? GERD (gastroesophageal reflux disease)   ? Tuberculosis   ? ? ?Past Surgical History:  ?Procedure Laterality Date  ? CESAREAN SECTION    ? x 2  ? ? ?Prior to Admission medications   ?Medication Sig Start Date End Date Taking? Authorizing Provider  ?famotidine (PEPCID) 40 MG tablet Take 20 mg by mouth 2 (two) times daily. 03/24/21  Yes [provider]  ?Multiple Vitamin (MULTIVITAMIN WITH MINERALS) TABS tablet Take 1 tablet by mouth daily.   Yes [provider]  ?pantoprazole (PROTONIX) 40 MG tablet Take 40 mg by mouth daily. 06/20/21  Yes [provider]  ? ? ?Current Outpatient Medications  ?Medication Sig Dispense Refill  ? famotidine (PEPCID) 40 MG tablet Take 20 mg by mouth 2 (two) times daily.    ? Multiple Vitamin (MULTIVITAMIN WITH MINERALS) TABS tablet Take 1 tablet by mouth daily.    ? pantoprazole (PROTONIX) 40 MG tablet Take 40 mg by mouth daily.    ? ?Current Facility-Administered Medications  ?Medication Dose Route Frequency Provider Last Rate Last Admin  ? 0.9 %  sodium chloride infusion  500 mL Intravenous Once Chancy Claros V, MD      ? 0.9 %  sodium chloride infusion  500 mL Intravenous Once Calder Oblinger, Venia Minks, MD      ? ? ?Allergies as of 09/10/2021  ? (No Known Allergies)  ? ? ?Family History  ?Problem Relation Age of Onset  ? Colon cancer Mother   ? Healthy Daughter   ? Esophageal  cancer Neg Hx   ? Stomach cancer Neg Hx   ? Rectal cancer Neg Hx   ? ? ?Social History  ? ?Socioeconomic History  ? Marital status: Married  ?  Spouse name: Not on file  ? Number of children: Not on file  ? Years of education: Not on file  ? Highest education level: Not on file  ?Occupational History  ? Not on file  ?Tobacco Use  ? Smoking status: Never  ? Smokeless tobacco: Never  ?Vaping Use  ? Vaping Use: Never used  ?Substance and Sexual Activity  ? Alcohol use: Never  ? Drug use: Never  ? Sexual activity: Not on file  ?Other Topics Concern  ? Not on file  ?Social History Narrative  ? Not on file  ? ?Social Determinants of Health  ? ?Financial Resource Strain: Not on file  ?Food Insecurity: Not on file  ?Transportation Needs: Not on file  ?Physical Activity: Not on file  ?Stress: Not on file  ?Social Connections: Not on file  ?Intimate Partner Violence: Not on file  ? ? ?Review of Systems: ? ?All other review of systems negative except as mentioned in the HPI. ? ?Physical Exam: ?Vital signs in last 24 hours: ?BP 103/68   Pulse 71   Temp 97.8 ?F (36.6 ?C) (Skin)   Ht 5'  2" (1.575 m)   Wt 106 lb (48.1 kg)   SpO2 97%   BMI 19.39 kg/m?  ?General:   Alert, NAD ?Lungs:  Clear .   ?Heart:  Regular rate and rhythm ?Abdomen:  Soft, nontender and nondistended. ?Neuro/Psych:  Alert and cooperative. Normal mood and affect. A and O x 3 ? ?Reviewed labs, radiology imaging, old records and pertinent past GI work up ? ?Patient is appropriate for planned procedure(s) and anesthesia in an ambulatory setting ? ? ?K. Denzil Magnuson , MD ?[phone]  ? ? ?  ?

## 2021-09-10 NOTE — Op Note (Signed)
[place] ?Patient Name: Jenny Mckenzie ?Procedure Date: 09/10/2021 9:46 AM ?MRN: [id number] ?Endoscopist: Mauri Pole , MD ?Age: 56 ?Referring MD:  ?Date of Birth: [birth year] ?Gender: Female ?Account #: [id number] ?Procedure:                Upper GI endoscopy ?Indications:              Upper abdominal symptoms that persist despite an  ?                          appropriate trial of therapy, Epigastric abdominal  ?                          pain, Dyspepsia ?Medicines:                Monitored Anesthesia Care ?Procedure:                Pre-Anesthesia Assessment: ?                          - Prior to the procedure, a History and Physical  ?                          was performed, and patient medications and  ?                          allergies were reviewed. The patient's tolerance of  ?                          previous anesthesia was also reviewed. The risks  ?                          and benefits of the procedure and the sedation  ?                          options and risks were discussed with the patient.  ?                          All questions were answered, and informed consent  ?                          was obtained. Prior Anticoagulants: The patient has  ?                          taken no previous anticoagulant or antiplatelet  ?                          agents. ASA Grade Assessment: II - A patient with  ?                          mild systemic disease. After reviewing the risks  ?                          and benefits, the patient was deemed in  ?  satisfactory condition to undergo the procedure. ?                          After obtaining informed consent, the endoscope was  ?                          passed under direct vision. Throughout the  ?                          procedure, the patient's blood pressure, pulse, and  ?                          oxygen saturations were monitored continuously. The  ?                          Endoscope was introduced through the  mouth, and  ?                          advanced to the second part of duodenum. The upper  ?                          GI endoscopy was accomplished without difficulty.  ?                          The patient tolerated the procedure well. ?Scope In: ?Scope Out: ?Findings:                 The Z-line was regular and was found 38 cm from the  ?                          incisors. ?                          No gross lesions were noted in the entire esophagus. ?                          A 2 cm hiatal hernia was present. ?                          Patchy mild inflammation characterized by  ?                          congestion (edema), erythema, friability and mucus  ?                          was found in the entire examined stomach. Biopsies  ?                          were taken with a cold forceps for Helicobacter  ?                          pylori testing. ?                          The cardia and gastric fundus were normal  on  ?                          retroflexion. ?                          The examined duodenum was normal. ?Complications:            No immediate complications. ?Estimated Blood Loss:     Estimated blood loss was minimal. ?Impression:               - Z-line regular, 38 cm from the incisors. ?                          - No gross lesions in esophagus. ?                          - 2 cm hiatal hernia. ?                          - Gastritis. Biopsied. ?                          - Normal examined duodenum. ?Recommendation:           - Patient has a contact number available for  ?                          emergencies. The signs and symptoms of potential  ?                          delayed complications were discussed with the  ?                          patient. Return to normal activities tomorrow.  ?                          Written discharge instructions were provided to the  ?                          patient. ?                          - Resume previous diet. ?                          - Continue  present medications. ?                          - Await pathology results. ?                          - Follow an antireflux regimen. ?                          - Return to GI office at the next available  ?                          appointment in 1-2  months. ?Mauri Pole, MD ?09/10/2021 10:06:00 AM ?This report has been signed electronically. ?

## 2021-09-11 ENCOUNTER — Other Ambulatory Visit: Payer: Self-pay

## 2021-09-12 ENCOUNTER — Telehealth: Payer: Self-pay | Admitting: *Deleted

## 2021-09-12 NOTE — Telephone Encounter (Signed)
?  Follow up Call- ? ? ?  09/10/2021  ?  9:21 AM 05/07/2020  ? 10:14 AM  ?Call back number  ?Post procedure Call Back phone  # [phone] cell Husband phone - [name] (he speaks english)  ?Permission to leave phone message Yes Yes  ?  ? ?Patient questions: ? ?Do you have a fever, pain , or abdominal swelling? No. ?Pain Score  0 * ? ?Have you tolerated food without any problems? Yes.   ? ?Have you been able to return to your normal activities? Yes.   ? ?Do you have any questions about your discharge instructions: ?Diet   No. ?Medications  No. ?Follow up visit  No. ? ?Do you have questions or concerns about your Care? No. ? ?Actions: ?* If pain score is 4 or above: ?No action needed, pain <4. ? ? ?

## 2021-09-29 ENCOUNTER — Other Ambulatory Visit: Payer: Self-pay

## 2021-09-29 MED ORDER — TALICIA 250-12.5-10 MG PO CPDR: 4.0000 | DELAYED_RELEASE_CAPSULE | Freq: Three times a day (TID) | ORAL | 0 refills | Status: DC

## 2021-10-01 ENCOUNTER — Telehealth: Payer: Self-pay

## 2021-10-01 ENCOUNTER — Encounter: Payer: Self-pay | Admitting: Gastroenterology

## 2021-10-01 DIAGNOSIS — A048 Other specified bacterial intestinal infections: Secondary | ICD-10-CM

## 2021-10-01 NOTE — Telephone Encounter (Signed)
Inbound call from patient. States the Jenny Mckenzie that was prescriped is to expensive and would like a cheaper alternative. ?

## 2021-10-01 NOTE — Telephone Encounter (Addendum)
She will have to have the ingredients separated, Insurance will usually cover that way   ? ? ?Dr Lyndel Safe you are listed as Doc of the Day  for the PM,  Can we prescribe this patient the ingredients to [place] for 14 days  ? ?Bismuth  524 mg  262 mg 2 tablets 4x a day for 14 days  ?Flagyl 250 mg   1 x four x a day for 14 days ?Doxycycline 100 mg  1 x twice a day x 14 days ?Omeprazole 40 mg    1 x twice a day for 14 days  ? ? ? ?Dr Silverio Decamp sent in prescriptions separate as directed for pt. I put lab order in for 1 month to recheck pt  ? ? ? ?

## 2021-10-01 NOTE — Telephone Encounter (Signed)
Absolutely ?Lets do that ? ?Once done with therapy, the patient should wait one month and perform a stool study for H pylori antigen to ensure negative. The PPI needs to be held at least 2 weeks prior to submitting the stool sample. ? ?RG ? ?

## 2021-10-02 ENCOUNTER — Other Ambulatory Visit: Payer: Self-pay

## 2021-10-02 MED ORDER — METRONIDAZOLE 250 MG PO TABS: 250.0000 mg | ORAL_TABLET | Freq: Four times a day (QID) | ORAL | 0 refills | Status: AC

## 2021-10-02 MED ORDER — OMEPRAZOLE 40 MG PO CPDR: 40.0000 mg | DELAYED_RELEASE_CAPSULE | Freq: Two times a day (BID) | ORAL | 0 refills | Status: DC

## 2021-10-02 MED ORDER — BISMUTH SUBSALICYLATE 262 MG PO TABS: 2.0000 | ORAL_TABLET | Freq: Four times a day (QID) | ORAL | 0 refills | Status: AC

## 2021-10-02 MED ORDER — DOXYCYCLINE HYCLATE 100 MG PO CAPS: 100.0000 mg | ORAL_CAPSULE | Freq: Two times a day (BID) | ORAL | 0 refills | Status: AC

## 2021-11-10 ENCOUNTER — Other Ambulatory Visit: Payer: PRIVATE HEALTH INSURANCE

## 2021-11-12 ENCOUNTER — Other Ambulatory Visit: Payer: Self-pay

## 2021-11-12 DIAGNOSIS — A048 Other specified bacterial intestinal infections: Secondary | ICD-10-CM

## 2021-11-12 LAB — H. PYLORI ANTIGEN, STOOL: H pylori Ag, Stl: POSITIVE — AB

## 2021-11-12 MED ORDER — TALICIA 250-12.5-10 MG PO CPDR: 4.0000 | DELAYED_RELEASE_CAPSULE | Freq: Three times a day (TID) | ORAL | 0 refills | Status: AC

## 2021-11-14 ENCOUNTER — Telehealth: Payer: Self-pay | Admitting: Gastroenterology

## 2021-11-14 NOTE — Telephone Encounter (Signed)
Explained the alternative medication had been the first treatment which was ineffective. Offered coupon for Jenny Mckenzie to see if this makes it more affordable. Medication after insurance is almost $300 which is not something the patient can afford.

## 2021-11-14 NOTE — Telephone Encounter (Signed)
Patient called requesting an alternative to talicia be sent to pharmacy. Insurance doesn't cover.   [phone]

## 2021-11-20 ENCOUNTER — Other Ambulatory Visit: Payer: PRIVATE HEALTH INSURANCE

## 2021-11-20 NOTE — Telephone Encounter (Signed)
Patient purchased the [place] for $122.44. The coupon made it more expensive. She comes by the office to confirm how and when to retest. Reviewed with her on her My Chart, which medications (omeprazole and famotidine) to hold for 2 weeks prior to testing. Testing will occur in 6 weeks after completion of the Talicia.

## 2021-12-03 ENCOUNTER — Ambulatory Visit: Payer: PRIVATE HEALTH INSURANCE | Admitting: Gastroenterology

## 2021-12-29 ENCOUNTER — Other Ambulatory Visit: Payer: PRIVATE HEALTH INSURANCE

## 2021-12-29 DIAGNOSIS — A048 Other specified bacterial intestinal infections: Secondary | ICD-10-CM

## 2021-12-31 LAB — H. PYLORI ANTIGEN, STOOL: H pylori Ag, Stl: NEGATIVE

## 2022-01-20 ENCOUNTER — Encounter: Payer: Self-pay | Admitting: Gastroenterology

## 2022-01-20 ENCOUNTER — Ambulatory Visit (INDEPENDENT_AMBULATORY_CARE_PROVIDER_SITE_OTHER): Payer: PRIVATE HEALTH INSURANCE | Admitting: Gastroenterology

## 2022-01-20 VITALS — BP 112/66 | HR 80 | Wt 104.0 lb

## 2022-01-20 DIAGNOSIS — K2970 Gastritis, unspecified, without bleeding: Secondary | ICD-10-CM

## 2022-01-20 DIAGNOSIS — B9681 Helicobacter pylori [H. pylori] as the cause of diseases classified elsewhere: Secondary | ICD-10-CM | POA: Diagnosis not present

## 2022-01-20 DIAGNOSIS — K219 Gastro-esophageal reflux disease without esophagitis: Secondary | ICD-10-CM

## 2022-01-20 MED ORDER — OMEPRAZOLE 40 MG PO CPDR: 40.0000 mg | DELAYED_RELEASE_CAPSULE | Freq: Every day | ORAL | 3 refills | Status: AC

## 2022-01-20 NOTE — Patient Instructions (Signed)
We have sent the following medications to your pharmacy for you to pick up at your convenience:   Omeprazole 40 mg  Gastroesophageal Reflux Disease, Adult Gastroesophageal reflux (GER) happens when acid from the stomach flows up into the tube that connects the mouth and the stomach (esophagus). Normally, food travels down the esophagus and stays in the stomach to be digested. However, when a person has GER, food and stomach acid sometimes move back up into the esophagus. If this becomes a more serious problem, the person may be diagnosed with a disease called gastroesophageal reflux disease (GERD). GERD occurs when the reflux: Happens often. Causes frequent or severe symptoms. Causes problems such as damage to the esophagus. When stomach acid comes in contact with the esophagus, the acid may cause inflammation in the esophagus. Over time, GERD may create small holes (ulcers) in the lining of the esophagus. What are the causes? This condition is caused by a problem with the muscle between the esophagus and the stomach (lower esophageal sphincter, or LES). Normally, the LES muscle closes after food passes through the esophagus to the stomach. When the LES is weakened or abnormal, it does not close properly, and that allows food and stomach acid to go back up into the esophagus. The LES can be weakened by certain dietary substances, medicines, and medical conditions, including: Tobacco use. Pregnancy. Having a hiatal hernia. Alcohol use. Certain foods and beverages, such as coffee, chocolate, onions, and peppermint. What increases the risk? You are more likely to develop this condition if you: Have an increased body weight. Have a connective tissue disorder. Take NSAIDs, such as ibuprofen. What are the signs or symptoms? Symptoms of this condition include: Heartburn. Difficult or painful swallowing and the feeling of having a lump in the throat. A bitter taste in the mouth. Bad breath and  having a large amount of saliva. Having an upset or bloated stomach and belching. Chest pain. Different conditions can cause chest pain. Make sure you see your health care provider if you experience chest pain. Shortness of breath or wheezing. Ongoing (chronic) cough or a nighttime cough. Wearing away of tooth enamel. Weight loss. How is this diagnosed? This condition may be diagnosed based on a medical history and a physical exam. To determine if you have mild or severe GERD, your health care provider may also monitor how you respond to treatment. You may also have tests, including: A test to examine your stomach and esophagus with a small camera (endoscopy). A test that measures the acidity level in your esophagus. A test that measures how much pressure is on your esophagus. A barium swallow or modified barium swallow test to show the shape, size, and functioning of your esophagus. How is this treated? Treatment for this condition may vary depending on how severe your symptoms are. Your health care provider may recommend: Changes to your diet. Medicine. Surgery. The goal of treatment is to help relieve your symptoms and to prevent complications. Follow these instructions at home: Eating and drinking  Follow a diet as recommended by your health care provider. This may involve avoiding foods and drinks such as: Coffee and tea, with or without caffeine. Drinks that contain alcohol. Energy drinks and sports drinks. Carbonated drinks or sodas. Chocolate and cocoa. Peppermint and mint flavorings. Garlic and onions. Horseradish. Spicy and acidic foods, including peppers, chili powder, curry powder, vinegar, hot sauces, and barbecue sauce. Citrus fruit juices and citrus fruits, such as oranges, lemons, and limes. Tomato-based foods, such as  red sauce, chili, salsa, and pizza with red sauce. Fried and fatty foods, such as donuts, french fries, potato chips, and high-fat  dressings. High-fat meats, such as hot dogs and fatty cuts of red and white meats, such as rib eye steak, sausage, ham, and bacon. High-fat dairy items, such as whole milk, butter, and cream cheese. Eat small, frequent meals instead of large meals. Avoid drinking large amounts of liquid with your meals. Avoid eating meals during the 2-3 hours before bedtime. Avoid lying down right after you eat. Do not exercise right after you eat. Lifestyle  Do not use any products that contain nicotine or tobacco. These products include cigarettes, chewing tobacco, and vaping devices, such as e-cigarettes. If you need help quitting, ask your health care provider. Try to reduce your stress by using methods such as yoga or meditation. If you need help reducing stress, ask your health care provider. If you are overweight, reduce your weight to an amount that is healthy for you. Ask your health care provider for guidance about a safe weight loss goal. General instructions Pay attention to any changes in your symptoms. Take over-the-counter and prescription medicines only as told by your health care provider. Do not take aspirin, ibuprofen, or other NSAIDs unless your health care provider told you to take these medicines. Wear loose-fitting clothing. Do not wear anything tight around your waist that causes pressure on your abdomen. Raise (elevate) the head of your bed about 6 inches (15 cm). You can use a wedge to do this. Avoid bending over if this makes your symptoms worse. Keep all follow-up visits. This is important. Contact a health care provider if: You have: New symptoms. Unexplained weight loss. Difficulty swallowing or it hurts to swallow. Wheezing or a persistent cough. A hoarse voice. Your symptoms do not improve with treatment. Get help right away if: You have sudden pain in your arms, neck, jaw, teeth, or back. You suddenly feel sweaty, dizzy, or light-headed. You have chest pain or shortness  of breath. You vomit and the vomit is green, yellow, or black, or it looks like blood or coffee grounds. You faint. You have stool that is red, bloody, or black. You cannot swallow, drink, or eat. These symptoms may represent a serious problem that is an emergency. Do not wait to see if the symptoms will go away. Get medical help right away. Call your local emergency services (911 in the [place] Do not drive yourself to the hospital. Summary Gastroesophageal reflux happens when acid from the stomach flows up into the esophagus. GERD is a disease in which the reflux happens often, causes frequent or severe symptoms, or causes problems such as damage to the esophagus. Treatment for this condition may vary depending on how severe your symptoms are. Your health care provider may recommend diet and lifestyle changes, medicine, or surgery. Contact a health care provider if you have new or worsening symptoms. Take over-the-counter and prescription medicines only as told by your health care provider. Do not take aspirin, ibuprofen, or other NSAIDs unless your health care provider told you to do so. Keep all follow-up visits as told by your health care provider. This is important. This information is not intended to replace advice given to you by your health care provider. Make sure you discuss any questions you have with your health care provider. Document Revised: 12/18/2019 Document Reviewed: 12/18/2019 Elsevier Patient Education  [employer].  If you are age 4 or older, your body mass index  should be between 23-30. Your Body mass index is 19.02 kg/m. If this is out of the aforementioned range listed, please consider follow up with your Primary Care Provider.  If you are age 18 or younger, your body mass index should be between 19-25. Your Body mass index is 19.02 kg/m. If this is out of the aformentioned range listed, please consider follow up with your Primary Care Provider.    ________________________________________________________  The [facility] providers would like to encourage you to use [facility] to communicate with providers for non-urgent requests or questions.  Due to long hold times on the telephone, sending your provider a message by [facility] may be a faster and more efficient way to get a response.  Please allow 48 business hours for a response.  Please remember that this is for non-urgent requests.  _______________________________________________________   I appreciate the  opportunity to care for you  Thank You   Harl Bowie , MD

## 2022-01-22 NOTE — Progress Notes (Signed)
Jenny Mckenzie    [id number]    [birth year]  Primary Care Physician:Jackson, Verl Dicker, PA-C  Referring Physician: Myrtie Neither, PA-C [address] [place],  [place] [address]   Chief complaint: Epigastric pain, history of H. pylori gastritis  HPI:  56 year old very pleasant female here for follow-up visit for H. pylori gastritis, dyspepsia and epigastric abdominal pain.  Overall she has significant improvement of epigastric discomfort, dyspepsia and heartburn after H. pylori treatment and eradication.  She is currently using PPI as needed.  Denies any abdominal pain, nausea, vomiting, dysphagia or odynophagia.  denies any rectal bleeding or melena.  EGD 09/10/21: - Z-line regular, 38 cm from the incisors. - No gross lesions in esophagus. - 2 cm hiatal hernia. - Gastritis. Biopsied. - Normal examined duodenum.   Colonoscopy 05/07/20 - Three 1 to 2 mm polyps [hyperplastic] at the recto-sigmoid colon, removed with a cold biopsy forceps. Resected and retrieved. - Non-bleeding internal hemorrhoids. - The examination was otherwise normal.   Outpatient Encounter Medications as of 01/20/2022  Medication Sig   Multiple Vitamin (MULTIVITAMIN WITH MINERALS) TABS tablet Take 1 tablet by mouth daily.   omeprazole (PRILOSEC) 40 MG capsule Take 1 capsule (40 mg total) by mouth daily. As needed   [DISCONTINUED] omeprazole (PRILOSEC) 40 MG capsule Take 1 capsule (40 mg total) by mouth 2 (two) times daily before a meal for 14 days. (Patient taking differently: Take 40 mg by mouth as needed.)   [DISCONTINUED] famotidine (PEPCID) 40 MG tablet Take 20 mg by mouth 2 (two) times daily.   Facility-Administered Encounter Medications as of 01/20/2022  Medication   0.9 %  sodium chloride infusion    Allergies as of 01/20/2022   (No Known Allergies)    Past Medical History:  Diagnosis Date   Anxiety    GERD (gastroesophageal reflux disease)    Tuberculosis     Past  Surgical History:  Procedure Laterality Date   CESAREAN SECTION     x 2    Family History  Problem Relation Age of Onset   Colon cancer Mother    Healthy Daughter    Esophageal cancer Neg Hx    Stomach cancer Neg Hx    Rectal cancer Neg Hx     Social History   Socioeconomic History   Marital status: Married    Spouse name: Not on file   Number of children: Not on file   Years of education: Not on file   Highest education level: Not on file  Occupational History   Not on file  Tobacco Use   Smoking status: Never   Smokeless tobacco: Never  Vaping Use   Vaping Use: Never used  Substance and Sexual Activity   Alcohol use: Never   Drug use: Never   Sexual activity: Not on file  Other Topics Concern   Not on file  Social History Narrative   Not on file   Social Determinants of Health   Financial Resource Strain: Not on file  Food Insecurity: Not on file  Transportation Needs: Not on file  Physical Activity: Not on file  Stress: Not on file  Social Connections: Not on file  Intimate Partner Violence: Not on file      Review of systems: All other review of systems negative except as mentioned in the HPI.   Physical Exam: Vitals:   01/20/22 0834  BP: 112/66  Pulse: 80  Body mass index is 19.02 kg/m. Gen:      No acute distress HEENT:  sclera anicteric Abd:      soft, non-tender; no palpable masses, no distension Ext:    No edema Neuro: alert and oriented x 3 Psych: normal mood and affect  Data Reviewed:  Reviewed labs, radiology imaging, old records and pertinent past GI work up   Assessment and Plan/Recommendations:  56 year old very pleasant female, [place] speaking complete by language interpreter with GERD, hiatal hernia, H. pylori gastritis s/p eradication Continue antireflux measures and lifestyle modifications Use omeprazole 40 mg daily as needed Return in 1 year or sooner if needed   The patient was provided an opportunity to ask  questions and all were answered. The patient agreed with the plan and demonstrated an understanding of the instructions.  Jenny Mckenzie , MD    CC: Myrtie Neither, PA-C

## 2022-01-26 ENCOUNTER — Encounter: Payer: Self-pay | Admitting: Gastroenterology

## 2022-05-25 LAB — HM MAMMOGRAPHY

## 2022-06-18 ENCOUNTER — Ambulatory Visit: Payer: PRIVATE HEALTH INSURANCE | Admitting: Family Medicine

## 2022-07-29 NOTE — Therapy (Signed)
OUTPATIENT PHYSICAL THERAPY LOWER EXTREMITY EVALUATION   Patient Name: Jenny Mckenzie MRN: [id number] DOB:April 12, 1966, 57 y.o., female Today's Date: 07/30/2022  END OF SESSION:  PT End of Session - 07/30/22 0928     Visit Number 1    Date for PT Re-Evaluation 10/08/22    Authorization Type --    PT Start Time 0927    PT Stop Time 1015    PT Time Calculation (min) 48 min    Activity Tolerance Patient tolerated treatment well    Behavior During Therapy [facility] for tasks assessed/performed             Past Medical History:  Diagnosis Date   Anxiety    GERD (gastroesophageal reflux disease)    Tuberculosis    Past Surgical History:  Procedure Laterality Date   CESAREAN SECTION     x 2   There are no problems to display for this patient.   PCP: Aurelio Jew  REFERRING PROVIDER: Tereasa Coop  REFERRING DIAG: [phone], [phone]  THERAPY DIAG:  Acute pain of left knee  Pain in left leg  Muscle weakness (generalized)  Rationale for Evaluation and Treatment: Rehabilitation  ONSET DATE: 07/15/22  SUBJECTIVE:   SUBJECTIVE STATEMENT: I fell 3 months ago, I slipped and fell forward on concrete. I hit the floor with my left knee.  PERTINENT HISTORY: No remarkable history PAIN:  Are you having pain? Yes: NPRS scale: 4/10 Pain location: around the L knee Pain description: dull, sometimes sharp Aggravating factors: going down stairs, sitting or standing for long time Relieving factors: heat pad, and hot oil  PRECAUTIONS: None  WEIGHT BEARING RESTRICTIONS: No  FALLS:  Has patient fallen in last 6 months? Yes. Number of falls 1  LIVING ENVIRONMENT: Lives with: lives with their family Lives in: House/apartment Stairs: Yes: Internal: 14 steps; bilateral but cannot reach both Has following equipment at home: None  OCCUPATION: Works at [employer] 3x a week  PLOF: Independent  PATIENT GOALS: "whatever you recommend"    OBJECTIVE:   DIAGNOSTIC FINDINGS:   IMPRESSION:  1. Severe patellar tendinosis/tendinitis. 2. Inferior patellar bone marrow edema, reactive or contusion. There is subtle curvilinear signal in the inferior patellar pole which is nonspecific, although could reflect sequela of a late subacute or old fracture. Correlation with x-rays may be of value. 3. Mild quadriceps tendinosis. 4. Mild ACL sprain versus mucoid degeneration. 5. No definite meniscal tear. Lateral meniscus anterior horn signal favored artifactual on the coronal images. Clinical correlation. 5. Posterior medial meniscocapsular sprain or synovitis. 6. Trace joint effusion. Edema in the peripatellar fat pads. 7. Anterior prepatellar/infrapatellar bursitis. 8. Patellar chondromalacia.   PATIENT SURVEYS:  FOTO 63  COGNITION: Overall cognitive status: Within functional limits for tasks assessed     SENSATION: WFL  MUSCLE LENGTH: Hamstrings: mild tightness in BLE  PALPATION: No TTP  LOWER EXTREMITY ROM:  Active ROM Right eval Left eval  Hip flexion WNL WNL  Hip extension    Hip abduction    Hip adduction    Hip internal rotation    Hip external rotation    Knee flexion WNL A little bit of pain at end range  Knee extension WNL WNL  Ankle dorsiflexion    Ankle plantarflexion    Ankle inversion    Ankle eversion     (Blank rows = not tested)  LOWER EXTREMITY MMT: grossly 4+/5  FUNCTIONAL TESTS:  5 times sit to stand: 11.78s  GAIT: Distance walked: in clinic  distances Assistive device utilized: None Level of assistance: Complete Independence   TODAY'S TREATMENT:                                                                                                                              DATE: 07/30/22- EVAL and ionto patch to L knee    PATIENT EDUCATION:  Education details: HEP and POC Person educated: Patient Education method: Explanation Education comprehension: verbalized understanding  HOME EXERCISE PROGRAM: Access Code:  [email or web address] URL: [email or web address] Date: 07/30/2022 Prepared by: Andris Baumann  Exercises - Seated Hamstring Stretch  - 2 x daily - 7 x weekly - 30 hold - Sit to Stand  - 1 x daily - 7 x weekly - 2 sets - 10 reps - Seated Knee Extension with Resistance  - 1 x daily - 7 x weekly - 2 sets - 10 reps - Hip Abduction with Resistance Loop  - 1 x daily - 7 x weekly - 2 sets - 10 reps  ASSESSMENT:  CLINICAL IMPRESSION: Patient is a 57 y.o. female who was seen today for physical therapy evaluation and treatment for left knee pain. She slipped on a fruit in the store and landed directly on her L knee about 3 months ago. Her MRI shows severe patellar tendinosis/tendinitis, mild ACL sprain, anterior prepatellar/infrapatellar bursitis, and patellar chondromalacia. She presents with good overall motion and strength however she continues to have pain. Educated her about inflammation and tendonitis and the importance of trying to ice and avoid repetitive movements to help wit healing. She works 3x a week at a [employer] and does a lot of sitting and standing. She has increased pain when sitting for too long. Did an ionto patch to her L knee today to address inflammation and pain. Patient will benefit from PT to address her impairments to decrease her pain.  OBJECTIVE IMPAIRMENTS: decreased activity tolerance and pain.   REHAB POTENTIAL: Good  CLINICAL DECISION MAKING: Stable/uncomplicated  EVALUATION COMPLEXITY: Low   GOALS: GOALS: Goals reviewed with patient? Yes  SHORT TERM GOALS: Target date: 09/03/22  Patient will be independent with initial HEP. Goal status: INITIAL   LONG TERM GOALS: Target date: 10/08/22  Patient will be independent with advanced/ongoing HEP to improve outcomes and carryover.  Goal status: INITIAL  2.  Patient will report at least 75% improvement in L knee pain to improve QOL. Baseline: 4/10 Goal status: INITIAL  3.  Patient will be able to tolerate at  least 30 mins of sitting and standing without pain.  Baseline: pain after ~77mns Goal status: INITIAL  4. Patient will be able to descend stairs without pain to safely to access home and community.  Baseline: pain with descending stairs Goal status: INITIAL  PLAN:  PT FREQUENCY: 1x/week  PT DURATION: 10 weeks  PLANNED INTERVENTIONS: Therapeutic exercises, Therapeutic activity, Neuromuscular re-education, Balance training, Gait training, Patient/Family education, Self Care, Joint mobilization, Stair training, Cryotherapy, Moist heat, Taping, Vasopneumatic device,  Ionotophoresis '4mg'$ /ml Dexamethasone, and Manual therapy  PLAN FOR NEXT SESSION: how was ionto patch, knee strengthening, ice   [employer], PT 07/30/2022, 10:39 AM

## 2022-07-30 ENCOUNTER — Ambulatory Visit: Payer: PRIVATE HEALTH INSURANCE | Attending: Physician Assistant

## 2022-07-30 DIAGNOSIS — M6281 Muscle weakness (generalized): Secondary | ICD-10-CM | POA: Diagnosis present

## 2022-07-30 DIAGNOSIS — M79605 Pain in left leg: Secondary | ICD-10-CM | POA: Insufficient documentation

## 2022-07-30 DIAGNOSIS — M25562 Pain in left knee: Secondary | ICD-10-CM | POA: Insufficient documentation

## 2022-08-13 ENCOUNTER — Other Ambulatory Visit: Payer: Self-pay

## 2022-08-13 ENCOUNTER — Ambulatory Visit: Payer: PRIVATE HEALTH INSURANCE

## 2022-08-13 DIAGNOSIS — M25562 Pain in left knee: Secondary | ICD-10-CM

## 2022-08-13 DIAGNOSIS — M79605 Pain in left leg: Secondary | ICD-10-CM

## 2022-08-13 DIAGNOSIS — M6281 Muscle weakness (generalized): Secondary | ICD-10-CM

## 2022-08-13 NOTE — Therapy (Signed)
OUTPATIENT PHYSICAL THERAPY LOWER EXTREMITY TREATMENT   Patient Name: Jenny Mckenzie MRN: [id number] DOB:1965-12-28, 57 y.o., female Today's Date: 08/13/2022  END OF SESSION:  PT End of Session - 08/13/22 0756     Visit Number 2    Date for PT Re-Evaluation 10/08/22    PT Start Time 0756    PT Stop Time 0845    PT Time Calculation (min) 49 min    Activity Tolerance Patient tolerated treatment well    Behavior During Therapy [facility] for tasks assessed/performed              Past Medical History:  Diagnosis Date   Anxiety    GERD (gastroesophageal reflux disease)    Tuberculosis    Past Surgical History:  Procedure Laterality Date   CESAREAN SECTION     x 2   There are no problems to display for this patient.   PCP: Aurelio Jew  REFERRING PROVIDER: Tereasa Coop  REFERRING DIAG: [phone], [phone]  THERAPY DIAG:  Acute pain of left knee  Pain in left leg  Muscle weakness (generalized)  Rationale for Evaluation and Treatment: Rehabilitation  ONSET DATE: 07/15/22  SUBJECTIVE:   SUBJECTIVE STATEMENT: Pain when moving into standing L thigh ant especially from higher seat and after sitting for long time.  Patch didn't seem to help much  PERTINENT HISTORY: No remarkable history PAIN:  Are you having pain? Yes: NPRS scale: 4/10 Pain location: around the L knee Pain description: dull, sometimes sharp Aggravating factors: going down stairs, sitting or standing for long time Relieving factors: heat pad, and hot oil  PRECAUTIONS: None  WEIGHT BEARING RESTRICTIONS: No  FALLS:  Has patient fallen in last 6 months? Yes. Number of falls 1  LIVING ENVIRONMENT: Lives with: lives with their family Lives in: House/apartment Stairs: Yes: Internal: 14 steps; bilateral but cannot reach both Has following equipment at home: None  OCCUPATION: Works at [employer] 3x a week  PLOF: Independent  PATIENT GOALS: "whatever you recommend"    OBJECTIVE:    DIAGNOSTIC FINDINGS:  IMPRESSION:  1. Severe patellar tendinosis/tendinitis. 2. Inferior patellar bone marrow edema, reactive or contusion. There is subtle curvilinear signal in the inferior patellar pole which is nonspecific, although could reflect sequela of a late subacute or old fracture. Correlation with x-rays may be of value. 3. Mild quadriceps tendinosis. 4. Mild ACL sprain versus mucoid degeneration. 5. No definite meniscal tear. Lateral meniscus anterior horn signal favored artifactual on the coronal images. Clinical correlation. 5. Posterior medial meniscocapsular sprain or synovitis. 6. Trace joint effusion. Edema in the peripatellar fat pads. 7. Anterior prepatellar/infrapatellar bursitis. 8. Patellar chondromalacia.   PATIENT SURVEYS:  FOTO 63  COGNITION: Overall cognitive status: Within functional limits for tasks assessed     SENSATION: WFL  MUSCLE LENGTH: Hamstrings: mild tightness in BLE  PALPATION: No TTP  LOWER EXTREMITY ROM:  Active ROM Right eval Left eval  Hip flexion WNL WNL  Hip extension    Hip abduction    Hip adduction    Hip internal rotation    Hip external rotation    Knee flexion WNL A little bit of pain at end range  Knee extension WNL WNL  Ankle dorsiflexion    Ankle plantarflexion    Ankle inversion    Ankle eversion     (Blank rows = not tested)  LOWER EXTREMITY MMT: grossly 4+/5  FUNCTIONAL TESTS:  5 times sit to stand: 11.78s  GAIT: Distance walked: in clinic  distances Assistive device utilized: None Level of assistance: Complete Independence   TODAY'S TREATMENT:                                                                                                                              DATE:  08/13/22: Neuromuscular re education/therex: Supine for e stim, Russian, 4 patches L quads, 7 min total with short arc quads and SLR to assist with motor recruitment, muscle re ed, with 3# wt   kinesiotaping L LE, 2 I pieces  with knee flexed at 60 degrees, attached tibial tuberosity and with 35% pull superiorly surrounding patella and with proximal ant thigh attachments.  Utilized to assist with myofascial/ motor recruitment of quads and better control/align L patella  Instructed in lateral heel taps with L foot on 2' block, to build up to 30 to 40 reps without UE hold and without fatigue, marked fasciculations noted today. Attempted forward heel taps but too much pain  Also instructed in SLR for home L LE  07/30/22- EVAL and ionto patch to L knee    PATIENT EDUCATION:  Education details: HEP and POC Person educated: Patient Education method: Explanation Education comprehension: verbalized understanding  HOME EXERCISE PROGRAM: Access Code: [email or web address] URL: [email or web address] Date: 08/13/2022 Prepared by: Zuley Lutter  Exercises - Lateral Step Up  - 1 x daily - 7 x weekly - 3 sets - 10 reps - Supine Active Straight Leg Raise  - 1 x daily - 3 x weekly - 3 sets - 10 reps Access Code: [email or web address] URL: [email or web address] Date: 07/30/2022 Prepared by: Andris Baumann  Exercises - Seated Hamstring Stretch  - 2 x daily - 7 x weekly - 30 hold - Sit to Stand  - 1 x daily - 7 x weekly - 2 sets - 10 reps - Seated Knee Extension with Resistance  - 1 x daily - 7 x weekly - 2 sets - 10 reps - Hip Abduction with Resistance Loop  - 1 x daily - 7 x weekly - 2 sets - 10 reps  ASSESSMENT:  CLINICAL IMPRESSION: Patient is a 57 y.o. female who was seen today for physical therapy  treatment for left knee pain. She slipped on a fruit in the store and landed directly on her L knee about 3 months ago. Today was her second skilled PT visit.  The iontophoresis did not help much at initial session, so changed Rx.  Noted marked atrophy L quads, therefore utilized techniques to improve motor recruitment as well as tape to relief infrapatellar irritation.  She responded well.  Will continue to reassess and  progress each visit.  OBJECTIVE IMPAIRMENTS: decreased activity tolerance and pain.   REHAB POTENTIAL: Good  CLINICAL DECISION MAKING: Stable/uncomplicated  EVALUATION COMPLEXITY: Low   GOALS: GOALS: Goals reviewed with patient? Yes  SHORT TERM GOALS: Target date: 09/03/22  Patient will be independent with initial HEP. Goal status: INITIAL   LONG TERM GOALS: Target date: 10/08/22  Patient will be independent with advanced/ongoing HEP to improve outcomes and carryover.  Goal status: INITIAL  2.  Patient will report at least 75% improvement in L knee pain to improve QOL. Baseline: 4/10 Goal status: INITIAL  3.  Patient will be able to tolerate at least 30 mins of sitting and standing without pain.  Baseline: pain after ~17mns Goal status: INITIAL  4. Patient will be able to descend stairs without pain to safely to access home and community.  Baseline: pain with descending stairs Goal status: INITIAL  PLAN:  PT FREQUENCY: 1x/week  PT DURATION: 10 weeks  PLANNED INTERVENTIONS: Therapeutic exercises, Therapeutic activity, Neuromuscular re-education, Balance training, Gait training, Patient/Family education, Self Care, Joint mobilization, Stair training, Cryotherapy, Moist heat, Taping, Vasopneumatic device, Ionotophoresis 429mml Dexamethasone, and Manual therapy  PLAN FOR NEXT SESSION: how was ionto patch, knee strengthening, ice   Mekhi Lascola L Lyfe Monger, PT 08/13/2022, 12:14 PM

## 2022-08-20 ENCOUNTER — Ambulatory Visit: Payer: PRIVATE HEALTH INSURANCE

## 2022-08-20 ENCOUNTER — Other Ambulatory Visit: Payer: Self-pay

## 2022-08-20 DIAGNOSIS — M25562 Pain in left knee: Secondary | ICD-10-CM | POA: Diagnosis not present

## 2022-08-20 DIAGNOSIS — M6281 Muscle weakness (generalized): Secondary | ICD-10-CM

## 2022-08-20 DIAGNOSIS — M79605 Pain in left leg: Secondary | ICD-10-CM

## 2022-08-20 NOTE — Therapy (Signed)
OUTPATIENT PHYSICAL THERAPY LOWER EXTREMITY TREATMENT   Patient Name: Jenny Mckenzie MRN: [id number] DOB:Mar 03, 1966, 57 y.o., female Today's Date: 08/20/2022  END OF SESSION:  PT End of Session - 08/20/22 0801     Visit Number 3    Date for PT Re-Evaluation 10/08/22    PT Start Time 0800    PT Stop Time 0845    PT Time Calculation (min) 45 min    Activity Tolerance Patient tolerated treatment well    Behavior During Therapy [facility] for tasks assessed/performed              Past Medical History:  Diagnosis Date   Anxiety    GERD (gastroesophageal reflux disease)    Tuberculosis    Past Surgical History:  Procedure Laterality Date   CESAREAN SECTION     x 2   There are no problems to display for this patient.   PCP: Aurelio Jew  REFERRING PROVIDER: Tereasa Coop  REFERRING DIAG: [phone], [phone]  THERAPY DIAG:  Acute pain of left knee  Pain in left leg  Muscle weakness (generalized)  Rationale for Evaluation and Treatment: Rehabilitation  ONSET DATE: 07/15/22  SUBJECTIVE:   SUBJECTIVE STATEMENT: Pain when moving into standing L thigh afer sitting too long, hurts going down hills.  Tape helped but didn't stay on very long. Is performing the exercises. Walking treadmill 15 min a day PERTINENT HISTORY: No remarkable history PAIN:  Are you having pain? Yes: NPRS scale: 3/10 Pain location: around the L knee Pain description: dull, sometimes sharp Aggravating factors: going down stairs, sitting or standing for long time Relieving factors: heat pad, and hot oil  PRECAUTIONS: None  WEIGHT BEARING RESTRICTIONS: No  FALLS:  Has patient fallen in last 6 months? Yes. Number of falls 1  LIVING ENVIRONMENT: Lives with: lives with their family Lives in: House/apartment Stairs: Yes: Internal: 14 steps; bilateral but cannot reach both Has following equipment at home: None  OCCUPATION: Works at [employer] 3x a week  PLOF: Independent  PATIENT GOALS:  "whatever you recommend"    OBJECTIVE:   DIAGNOSTIC FINDINGS:  IMPRESSION:  1. Severe patellar tendinosis/tendinitis. 2. Inferior patellar bone marrow edema, reactive or contusion. There is subtle curvilinear signal in the inferior patellar pole which is nonspecific, although could reflect sequela of a late subacute or old fracture. Correlation with x-rays may be of value. 3. Mild quadriceps tendinosis. 4. Mild ACL sprain versus mucoid degeneration. 5. No definite meniscal tear. Lateral meniscus anterior horn signal favored artifactual on the coronal images. Clinical correlation. 5. Posterior medial meniscocapsular sprain or synovitis. 6. Trace joint effusion. Edema in the peripatellar fat pads. 7. Anterior prepatellar/infrapatellar bursitis. 8. Patellar chondromalacia.   PATIENT SURVEYS:  FOTO 63  COGNITION: Overall cognitive status: Within functional limits for tasks assessed     SENSATION: WFL  MUSCLE LENGTH: Hamstrings: mild tightness in BLE  PALPATION: No TTP  LOWER EXTREMITY ROM:  Active ROM Right eval Left eval  Hip flexion WNL WNL  Hip extension    Hip abduction    Hip adduction    Hip internal rotation    Hip external rotation    Knee flexion WNL A little bit of pain at end range  Knee extension WNL WNL  Ankle dorsiflexion    Ankle plantarflexion    Ankle inversion    Ankle eversion     (Blank rows = not tested)  LOWER EXTREMITY MMT: grossly 4+/5  FUNCTIONAL TESTS:  5 times sit to  stand: 11.78s  GAIT: Distance walked: in clinic distances Assistive device utilized: None Level of assistance: Complete Independence   TODAY'S TREATMENT:                                                                                                                              DATE:  08/20/22: Reassessed patient's ROM, strength, normal MMT hip abductors, hip flexors.  Pain with MMT L LAQ Some pain at end range L knee flexion.  L patella with mild lat tilt noted,  still mild edema L knee jt anteriorly. Manual: patellar mobs, primarily inferior patellar glides with knee flexed at 60 degrees to improve patellar tracking, instructed patient in same technique Kinesiotaping: kinesiotaping L LE, 2 I pieces with knee flexed at 60 degrees, attached tibial tuberosity and with 35% pull superiorly surrounding patella and with proximal ant thigh attachments.  Utilized to assist with myofascial/ motor recruitment of quads and better control/align L patella  Therex:  reviewed patient's lateral step downs, to R heel from 2" step, she is now performing forward heel taps, easily without pain.  Also able to perform without UE support, still obvious fasciculations and sway.  Attempted to advance to 6" step and too painful, so progressed intensity of ex to 2" forward step downs while holding 2# wt R hand Also added forward high knee drivers, L foot on second step on stairway, with high march R LE to emphasize L gluteals, mid quads. Advised to perform 3 x week to fatigue for strength building Towel roll in popliteal space with L knee flexion with overpressure to gap, provide stretch patellar tendon attachments.   08/13/22: Neuromuscular re education/therex: Supine for e stim, Russian, 4 patches L quads, 7 min total with short arc quads and SLR to assist with motor recruitment, muscle re ed, with 3# wt   kinesiotaping L LE, 2 I pieces with knee flexed at 60 degrees, attached tibial tuberosity and with 35% pull superiorly surrounding patella and with proximal ant thigh attachments.  Utilized to assist with myofascial/ motor recruitment of quads and better control/align L patella  Instructed in lateral heel taps with L foot on 2' block, to build up to 30 to 40 reps without UE hold and without fatigue, marked fasciculations noted today. Attempted forward heel taps but too much pain  Also instructed in SLR for home L LE  07/30/22- EVAL and ionto patch to L knee    PATIENT EDUCATION:   Education details: HEP and POC Person educated: Patient Education method: Explanation Education comprehension: verbalized understanding  HOME EXERCISE PROGRAM: Access Code: [email or web address] URL: [email or web address] Date: 08/13/2022 Prepared by: Sidi Dzikowski  Exercises - Lateral Step Up  - 1 x daily - 7 x weekly - 3 sets - 10 reps - Supine Active Straight Leg Raise  - 1 x daily - 3 x weekly - 3 sets - 10 reps Access Code: [email or web address] URL: [email or web address] Date: 07/30/2022 Prepared by: Andris Baumann  Exercises -  Seated Hamstring Stretch  - 2 x daily - 7 x weekly - 30 hold - Sit to Stand  - 1 x daily - 7 x weekly - 2 sets - 10 reps - Seated Knee Extension with Resistance  - 1 x daily - 7 x weekly - 2 sets - 10 reps - Hip Abduction with Resistance Loop  - 1 x daily - 7 x weekly - 2 sets - 10 reps  ASSESSMENT:  CLINICAL IMPRESSION: Patient is a 57 y.o. female who was seen today for physical therapy  treatment for left knee pain. She slipped on a fruit in the store and landed directly on her L knee about 3 months ago. Today was her third skilled PT visit.  She is frustrated as prior to injury was a runner.  Also not seeing huge gains in regard to her L knee Sx. So far.  Wants to eventually be able to return to running.  Did note much improved eccentric control and balance with functional testing again today.  Reassured her that when her strength L quads is improved that her knee pain and control should improve.  She still has atrophy L quads that is quite visible in comparison to R. Decided to space her visits out, to return in 3 weeks, to allow time for exercises to work and build her strength.  OBJECTIVE IMPAIRMENTS: decreased activity tolerance and pain.   REHAB POTENTIAL: Good  CLINICAL DECISION MAKING: Stable/uncomplicated  EVALUATION COMPLEXITY: Low   GOALS: GOALS: Goals reviewed with patient? Yes  SHORT TERM GOALS: Target date: 09/03/22  Patient will  be independent with initial HEP. Goal status: IN PROGRESS   LONG TERM GOALS: Target date: 10/08/22  Patient will be independent with advanced/ongoing HEP to improve outcomes and carryover.  Goal status: IN PROGRESS  2.  Patient will report at least 75% improvement in L knee pain to improve QOL. Baseline: 4/10 Goal status: IN PROGRESS  3.  Patient will be able to tolerate at least 30 mins of sitting and standing without pain.  Baseline: pain after ~73mns Goal status: IN PROGRESS  4. Patient will be able to descend stairs without pain to safely to access home and community.  Baseline: pain with descending stairs Goal status: IN PROGRESS  PLAN:  PT FREQUENCY: 1x/week  PT DURATION: 10 weeks  PLANNED INTERVENTIONS: Therapeutic exercises, Therapeutic activity, Neuromuscular re-education, Balance training, Gait training, Patient/Family education, Self Care, Joint mobilization, Stair training, Cryotherapy, Moist heat, Taping, Vasopneumatic device, Ionotophoresis '4mg'$ /ml Dexamethasone, and Manual therapy  PLAN FOR NEXT SESSION: how was taping, functional knee strengthening, ice   Sophonie Goforth L Zaydenn Balaguer, PT 08/20/2022, 12:47 PM

## 2022-08-26 ENCOUNTER — Ambulatory Visit: Payer: PRIVATE HEALTH INSURANCE

## 2022-09-02 ENCOUNTER — Ambulatory Visit: Payer: PRIVATE HEALTH INSURANCE

## 2022-09-10 ENCOUNTER — Encounter: Payer: PRIVATE HEALTH INSURANCE | Admitting: Physical Therapy

## 2022-09-10 ENCOUNTER — Ambulatory Visit: Payer: PRIVATE HEALTH INSURANCE

## 2022-09-15 ENCOUNTER — Encounter: Payer: Self-pay | Admitting: Internal Medicine

## 2022-09-15 ENCOUNTER — Ambulatory Visit (INDEPENDENT_AMBULATORY_CARE_PROVIDER_SITE_OTHER): Payer: PRIVATE HEALTH INSURANCE | Admitting: Internal Medicine

## 2022-09-15 VITALS — BP 98/60 | HR 70 | Temp 97.8°F | Ht 62.0 in | Wt 107.0 lb

## 2022-09-15 DIAGNOSIS — Z1159 Encounter for screening for other viral diseases: Secondary | ICD-10-CM

## 2022-09-15 DIAGNOSIS — K219 Gastro-esophageal reflux disease without esophagitis: Secondary | ICD-10-CM

## 2022-09-15 DIAGNOSIS — Z0000 Encounter for general adult medical examination without abnormal findings: Secondary | ICD-10-CM | POA: Insufficient documentation

## 2022-09-15 LAB — COMPREHENSIVE METABOLIC PANEL
ALT: 18 U/L (ref 0–35)
AST: 21 U/L (ref 0–37)
Albumin: 4.6 g/dL (ref 3.5–5.2)
Alkaline Phosphatase: 76 U/L (ref 39–117)
BUN: 13 mg/dL (ref 6–23)
CO2: 32 mEq/L (ref 19–32)
Calcium: 9.8 mg/dL (ref 8.4–10.5)
Chloride: 103 mEq/L (ref 96–112)
Creatinine, Ser: 0.54 mg/dL (ref 0.40–1.20)
GFR: 103.19 mL/min (ref >60.00)
Glucose, Bld: 88 mg/dL (ref 70–99)
Potassium: 4.1 mEq/L (ref 3.5–5.1)
Sodium: 141 mEq/L (ref 135–145)
Total Bilirubin: 0.6 mg/dL (ref 0.2–1.2)
Total Protein: 7.9 g/dL (ref 6.0–8.3)

## 2022-09-15 LAB — CBC
HCT: 41.8 % (ref 36.0–46.0)
Hemoglobin: 14.2 g/dL (ref 12.0–15.0)
MCHC: 34 g/dL (ref 30.0–36.0)
MCV: 91.9 fl (ref 78.0–100.0)
Platelets: 229 10*3/uL (ref 150.0–400.0)
RBC: 4.55 Mil/uL (ref 3.87–5.11)
RDW: 12.6 % (ref 11.5–15.5)
WBC: 4 10*3/uL (ref 4.0–10.5)

## 2022-09-15 LAB — LIPID PANEL
Cholesterol: 203 mg/dL — ABNORMAL HIGH (ref 0–200)
HDL: 70 mg/dL (ref >39.00)
LDL Cholesterol: 111 mg/dL — ABNORMAL HIGH (ref 0–99)
NonHDL: 132.64
Total CHOL/HDL Ratio: 3
Triglycerides: 106 mg/dL (ref 0.0–149.0)
VLDL: 21.2 mg/dL (ref 0.0–40.0)

## 2022-09-15 NOTE — Progress Notes (Signed)
   Subjective:   Patient ID: Jenny Mckenzie, female    DOB: 20-Oct-1965, 57 y.o.   MRN: [id number]  HPI The patient is a new 56 YO female coming in for physical. Interpretor present.   PMH, [facility], social history reviewed and updated  Review of Systems  Constitutional: Negative.   HENT: Negative.    Eyes: Negative.   Respiratory:  Negative for cough, chest tightness and shortness of breath.   Cardiovascular:  Negative for chest pain, palpitations and leg swelling.  Gastrointestinal:  Negative for abdominal distention, abdominal pain, constipation, diarrhea, nausea and vomiting.  Musculoskeletal: Negative.   Skin: Negative.   Neurological: Negative.   Psychiatric/Behavioral: Negative.      Objective:  Physical Exam Constitutional:      Appearance: She is well-developed.  HENT:     Head: Normocephalic and atraumatic.  Cardiovascular:     Rate and Rhythm: Normal rate and regular rhythm.  Pulmonary:     Effort: Pulmonary effort is normal. No respiratory distress.     Breath sounds: Normal breath sounds. No wheezing or rales.  Abdominal:     General: Bowel sounds are normal. There is no distension.     Palpations: Abdomen is soft.     Tenderness: There is no abdominal tenderness. There is no rebound.  Musculoskeletal:     Cervical back: Normal range of motion.  Skin:    General: Skin is warm and dry.  Neurological:     Mental Status: She is alert and oriented to person, place, and time.     Coordination: Coordination normal.     Vitals:   09/15/22 0913  BP: 98/60  Pulse: 70  Temp: 97.8 F (36.6 C)  TempSrc: Oral  SpO2: 97%  Weight: 107 lb (48.5 kg)  Height: 5\' 2"  (1.575 m)    Assessment & Plan:

## 2022-09-15 NOTE — Assessment & Plan Note (Signed)
Taking prilosec prn and well controlled after prior H pylori treatment.

## 2022-09-15 NOTE — Assessment & Plan Note (Signed)
Flu shot up to date. Shingrix complete getting records. Tetanus up to date. Colonoscopy up to date. Mammogram up to date, pap smear up to date abstracting. Counseled about sun safety and mole surveillance. Counseled about the dangers of distracted driving. Given 10 year screening recommendations.

## 2022-09-16 LAB — HEPATITIS C ANTIBODY: Hepatitis C Ab: NONREACTIVE

## 2023-05-31 ENCOUNTER — Encounter: Payer: Self-pay | Admitting: Internal Medicine

## 2023-05-31 LAB — HM MAMMOGRAPHY

## 2023-06-24 NOTE — Progress Notes (Signed)
 06/24/2023 Jenny Mckenzie [id number] Feb 07, 1966   Chief Complaint: Upper abdominal pain, worse after eating   History of Present Illness: Jenny Mckenzie is a 58 year old Vietnamese female with a past medical history of anxiety, TB, GERD, H. Pylori gastritis and colon polyps. She is known by Dr. Shila. She underwent an EGD 09/10/2021 which showed H. Pylori gastritis treated with a 10 day course of Doxycycline , Flagyl , Omeprazole  and Pepto bismol Post treatment H. Pylori stool antigen 11/10/2021 remained positive. She was prescribed Talicia  4 capsules po tid x 14 days. Post treatment H. Pylori stool antigen 12/29/2021 was negative. She presents today due to having epigastric pain x 1 1/2 months. She is accompanied by her daughter and [place] vietnamese interpretor who facilitated communication throughout today's office visit. Every times she eats, she has upper abdominal pain. She endorses having a lot of abdominal gas, notably worse at night. She has some generalized abdominal cramping.  No nausea or vomiting.  Eating larger portions worsen symptoms. She is eating smaller meals, scared to eat. Infrequent heartburn. No dysphagia with solids or liquids but sometimes has difficulty swallowing large pills. She is passing solid brown stools most days. No black or bright red bloody stools. She had diarrhea x 2 episodes over the past 2 to 3 months. She sometimes sees nonbloody mucous when constipated. No NSAID use. She drinks a little coffee with mild in the morning. Rare alcohol, on holidays only. No weight loss.      Latest Ref Rng & Units 09/15/2022    9:37 AM 06/30/2018   11:37 AM  CBC  WBC 4.0 - 10.5 K/uL 4.0  5.1   Hemoglobin 12.0 - 15.0 g/dL 85.7  85.6   Hematocrit 36.0 - 46.0 % 41.8  42.6   Platelets 150.0 - 400.0 K/uL 229.0  255        Latest Ref Rng & Units 09/15/2022    9:37 AM 06/30/2018   11:37 AM  CMP  Glucose 70 - 99 mg/dL 88  84   BUN 6 - 23 mg/dL 13  11   Creatinine 9.59 - 1.20  mg/dL 9.45  9.51   Sodium 864 - 145 mEq/L 141  141   Potassium 3.5 - 5.1 mEq/L 4.1  4.4   Chloride 96 - 112 mEq/L 103  100   CO2 19 - 32 mEq/L 32  26   Calcium 8.4 - 10.5 mg/dL 9.8  9.8   Total Protein 6.0 - 8.3 g/dL 7.9  7.7   Total Bilirubin 0.2 - 1.2 mg/dL 0.6  0.6   Alkaline Phos 39 - 117 U/L 76  93   AST 0 - 37 U/L 21  22   ALT 0 - 35 U/L 18  20     RUQ 07/29/2021: EXAM: ULTRASOUND ABDOMEN LIMITED RIGHT UPPER QUADRANT   COMPARISON:  None.   FINDINGS: Gallbladder: A moderate to marked amount of heterogeneous echogenic sludge is seen within the gallbladder lumen. No gallstones or wall thickening visualized (2.1 mm). No sonographic Murphy sign noted by sonographer.   Common bile duct: Diameter: 2.0 mm   Liver: No focal lesion identified. Within normal limits in parenchymal echogenicity. Portal vein is patent on color Doppler imaging with normal direction of blood flow towards the liver.   Other: None.   IMPRESSION: Gallbladder sludge without evidence of cholelithiasis or cholecystitis.   PAST GI PROCEDURES:  EGD 09/10/2021: - Z-line regular, 38 cm from the incisors. - No gross  lesions in esophagus. - 2 cm hiatal hernia. - Gastritis. Biopsied. - Normal examined duodenum. MILDLY ACTIVE CHRONIC HELICOBACTER PYLORI GASTRITIS. - IMMUNOHISTOCHEMISTRY FOR HELICOBACTER PYLORI IS POSITIVE.   Colonoscopy 05/07/20: - Three 1 to 2 mm polyps [hyperplastic] at the recto-sigmoid colon, removed with a cold biopsy forceps. Resected and retrieved. - Non-bleeding internal hemorrhoids. - The examination was otherwise normal. - 5 year recall colonoscopy due to mother with history of colon cancer  Surgical [P], colon, rectosigmoid, polyp (3) - HYPERPLASTIC POLYP(S)  Current Outpatient Medications on File Prior to Visit  Medication Sig Dispense Refill   Multiple Vitamin (MULTIVITAMIN WITH MINERALS) TABS tablet Take 1 tablet by mouth daily.     omeprazole  (PRILOSEC) 40 MG capsule  Take 1 capsule (40 mg total) by mouth daily. As needed 30 capsule 3   No current facility-administered medications on file prior to visit.   No Known Allergies  Current Medications, Allergies, Past Medical History, Past Surgical History, Family History and Social History were reviewed in [employer] record.  Review of Systems:   Constitutional: Negative for fever, sweats, chills or weight loss.  Respiratory: Negative for shortness of breath.   Cardiovascular: Negative for chest pain, palpitations and leg swelling.  Gastrointestinal: See HPI.  Musculoskeletal: Negative for back pain or muscle aches.  Neurological: Negative for dizziness, headaches or paresthesias.   Physical Exam: BP 100/60   Pulse 74   Ht 5' 4 (1.626 m)   Wt 107 lb 2 oz (48.6 kg)   SpO2 99%   BMI 18.39 kg/m   General: 58 year old female in no acute distress. Head: Normocephalic and atraumatic. Eyes: No scleral icterus. Conjunctiva pink . Ears: Normal auditory acuity. Mouth: Dentition intact. No ulcers or lesions.  Lungs: Clear throughout to auscultation. Heart: Regular rate and rhythm, no murmur. Abdomen: Soft, nontender and nondistended. No masses or hepatomegaly. Normal bowel sounds x 4 quadrants.  Rectal: Deferred.  Musculoskeletal: Symmetrical with no gross deformities. Extremities: No edema. Neurological: Alert oriented x 4. No focal deficits.  Psychological: Alert and cooperative. Normal mood and affect  Assessment and Recommendations:  58 year old female vietnamese with a history of GERD and H. Pylori gastritis with recurrent epigastric pain x 2 to 3 months. EGD 09/10/2021 showed H. Pylori gastritis treated with a 10 day course of Doxycycline , Flagyl , Omeprazole  and Pepto bismol. Post treatment H. Pylori stool antigen 11/10/2021 remained positive. She was prescribed Talicia  4 capsules po tid x 14 days. Post treatment H. Pylori stool antigen 12/29/2021 was negative.  She lives with  her husband and daughter, they have not been tested for H. pylori.  It is possible that the patient has recurrent H. pylori gastritis.  RUQ sonogram 07/2021 showed gallbladder sludge without cholelithiasis. -Check Diatherix H. pylori stool antigen, if result positive patient will require retreatment and household members (spouse and daughter) should also be checked for H. pylori to reduce risk of H. Pylori recontamination -Continue omeprazole  40 mg p.o. daily -May take Pepcid  20 mg OTC 1 tab p.o. daily as needed -Continue GERD diet -CBC and CMP -Patient to contact office if symptoms worsen  History of colon polyps.  Colonoscopy 04/2020 identified 3 hyperplastic polyps removed from the rectosigmoid colon.  Mother with history of colon cancer. -Next colonoscopy due 04/2025  Today's encounter was 25 minutes which included precharting, chart/result review, history/exam, face-to-face time used for counseling, formulating a treatment plan with follow-up and documentation.

## 2023-06-25 ENCOUNTER — Encounter: Payer: Self-pay | Admitting: Nurse Practitioner

## 2023-06-25 ENCOUNTER — Ambulatory Visit (INDEPENDENT_AMBULATORY_CARE_PROVIDER_SITE_OTHER): Payer: PRIVATE HEALTH INSURANCE | Admitting: Nurse Practitioner

## 2023-06-25 ENCOUNTER — Other Ambulatory Visit (INDEPENDENT_AMBULATORY_CARE_PROVIDER_SITE_OTHER): Payer: PRIVATE HEALTH INSURANCE

## 2023-06-25 VITALS — BP 100/60 | HR 74 | Ht 64.0 in | Wt 107.1 lb

## 2023-06-25 DIAGNOSIS — K2970 Gastritis, unspecified, without bleeding: Secondary | ICD-10-CM

## 2023-06-25 DIAGNOSIS — Z860102 Personal history of hyperplastic colon polyps: Secondary | ICD-10-CM

## 2023-06-25 DIAGNOSIS — K219 Gastro-esophageal reflux disease without esophagitis: Secondary | ICD-10-CM

## 2023-06-25 DIAGNOSIS — Z8619 Personal history of other infectious and parasitic diseases: Secondary | ICD-10-CM

## 2023-06-25 DIAGNOSIS — K5900 Constipation, unspecified: Secondary | ICD-10-CM | POA: Diagnosis not present

## 2023-06-25 DIAGNOSIS — Z800 Family history of malignant neoplasm of digestive organs: Secondary | ICD-10-CM

## 2023-06-25 DIAGNOSIS — R1013 Epigastric pain: Secondary | ICD-10-CM | POA: Diagnosis not present

## 2023-06-25 LAB — CBC WITH DIFFERENTIAL/PLATELET
Basophils Absolute: 0 10*3/uL (ref 0.0–0.1)
Basophils Relative: 0.4 % (ref 0.0–3.0)
Eosinophils Absolute: 0 10*3/uL (ref 0.0–0.7)
Eosinophils Relative: 0.8 % (ref 0.0–5.0)
HCT: 40 % (ref 36.0–46.0)
Hemoglobin: 13.4 g/dL (ref 12.0–15.0)
Lymphocytes Relative: 38.4 % (ref 12.0–46.0)
Lymphs Abs: 1.5 10*3/uL (ref 0.7–4.0)
MCHC: 33.4 g/dL (ref 30.0–36.0)
MCV: 93.4 fL (ref 78.0–100.0)
Monocytes Absolute: 0.2 10*3/uL (ref 0.1–1.0)
Monocytes Relative: 6.1 % (ref 3.0–12.0)
Neutro Abs: 2.1 10*3/uL (ref 1.4–7.7)
Neutrophils Relative %: 54.3 % (ref 43.0–77.0)
Platelets: 241 10*3/uL (ref 150.0–400.0)
RBC: 4.29 Mil/uL (ref 3.87–5.11)
RDW: 12.5 % (ref 11.5–15.5)
WBC: 4 10*3/uL (ref 4.0–10.5)

## 2023-06-25 LAB — COMPREHENSIVE METABOLIC PANEL
ALT: 18 U/L (ref 0–35)
AST: 20 U/L (ref 0–37)
Albumin: 4.4 g/dL (ref 3.5–5.2)
Alkaline Phosphatase: 72 U/L (ref 39–117)
BUN: 13 mg/dL (ref 6–23)
CO2: 31 meq/L (ref 19–32)
Calcium: 9.5 mg/dL (ref 8.4–10.5)
Chloride: 103 meq/L (ref 96–112)
Creatinine, Ser: 0.6 mg/dL (ref 0.40–1.20)
GFR: 100.06 mL/min (ref >60.00)
Glucose, Bld: 82 mg/dL (ref 70–99)
Potassium: 3.9 meq/L (ref 3.5–5.1)
Sodium: 141 meq/L (ref 135–145)
Total Bilirubin: 0.6 mg/dL (ref 0.2–1.2)
Total Protein: 7.6 g/dL (ref 6.0–8.3)

## 2023-06-25 NOTE — Patient Instructions (Addendum)
 Your provider has requested that you go to the basement level for lab work before leaving today. Press B on the elevator. The lab is located at the first door on the left as you exit the elevator.  Your provider has ordered Diatherix stool testing for you. You have received a kit from our office today containing all necessary supplies to complete this test. Please carefully read the stool collection instructions provided in the kit before opening the accompanying materials. In addition, be sure to place the label from the top right corner of the laboratory request sheet onto the puritan opti-swab tube that is supplied in the kit. This label should include your full name and date of birth. After completing the test, you should secure the purtian tube into the specimen biohazard bag. The laboratory request information sheet (including date and time of specimen collection) should be placed into the outside pocket of the specimen biohazard bag and returned to the [place] lab with 2 days of collection.   If the laboratory information sheet specimen date and time are not filled out, the test will NOT be performed.   Benefiber- take 1 tablespoon daily  Continue Omeprazole  40 mg daily  May take Pepcid  20 mg- take 1 by mouth daily as needed for upper abdominal pain (over the counter)  Due to recent changes in healthcare laws, you may see the results of your imaging and laboratory studies on MyChart before your provider has had a chance to review them.  We understand that in some cases there may be results that are confusing or concerning to you. Not all laboratory results come back in the same time frame and the provider may be waiting for multiple results in order to interpret others.  Please give us  48 hours in order for your provider to thoroughly review all the results before contacting the office for clarification of your results.   Thank you for trusting me with your gastrointestinal care!    Elida Shawl, CRNP

## 2023-06-28 ENCOUNTER — Ambulatory Visit: Payer: PRIVATE HEALTH INSURANCE | Admitting: Internal Medicine

## 2023-07-06 ENCOUNTER — Telehealth: Payer: Self-pay | Admitting: Nurse Practitioner

## 2023-07-06 NOTE — Telephone Encounter (Signed)
 Patient returned call and patient verbalized understanding of normal stool test. Patient states she is no longer having any abdominal pain.

## 2023-07-06 NOTE — Telephone Encounter (Signed)
 DD, please have [place] translator contact the patient and let her know her Diatherix H. pylori stool antigen test was negative.  Please verify if patient continues to have upper abdominal pain.  Let me know outcome.  Thank you

## 2023-07-06 NOTE — Telephone Encounter (Signed)
 Contacted [facility] interpreter services & interpreter attempted to contact patient and left a voice mail.

## 2023-09-20 ENCOUNTER — Encounter: Payer: Self-pay | Admitting: Internal Medicine

## 2023-09-20 ENCOUNTER — Other Ambulatory Visit (HOSPITAL_COMMUNITY)
Admission: RE | Admit: 2023-09-20 | Discharge: 2023-09-20 | Disposition: A | Discharge status: Home / Self Care | Payer: PRIVATE HEALTH INSURANCE | Source: Ambulatory Visit | Attending: Internal Medicine | Admitting: Internal Medicine

## 2023-09-20 ENCOUNTER — Ambulatory Visit (INDEPENDENT_AMBULATORY_CARE_PROVIDER_SITE_OTHER): Payer: PRIVATE HEALTH INSURANCE | Admitting: Internal Medicine

## 2023-09-20 VITALS — BP 102/70 | HR 67 | Temp 98.5°F | Ht 64.0 in | Wt 107.2 lb

## 2023-09-20 DIAGNOSIS — K219 Gastro-esophageal reflux disease without esophagitis: Secondary | ICD-10-CM | POA: Diagnosis not present

## 2023-09-20 DIAGNOSIS — Z0000 Encounter for general adult medical examination without abnormal findings: Secondary | ICD-10-CM | POA: Diagnosis not present

## 2023-09-20 DIAGNOSIS — Z124 Encounter for screening for malignant neoplasm of cervix: Secondary | ICD-10-CM

## 2023-09-20 LAB — COMPREHENSIVE METABOLIC PANEL WITH GFR
ALT: 18 U/L (ref 0–35)
AST: 22 U/L (ref 0–37)
Albumin: 4.4 g/dL (ref 3.5–5.2)
Alkaline Phosphatase: 83 U/L (ref 39–117)
BUN: 15 mg/dL (ref 6–23)
CO2: 31 meq/L (ref 19–32)
Calcium: 9.4 mg/dL (ref 8.4–10.5)
Chloride: 103 meq/L (ref 96–112)
Creatinine, Ser: 0.57 mg/dL (ref 0.40–1.20)
GFR: 101.14 mL/min (ref >60.00)
Glucose, Bld: 88 mg/dL (ref 70–99)
Potassium: 4 meq/L (ref 3.5–5.1)
Sodium: 141 meq/L (ref 135–145)
Total Bilirubin: 0.4 mg/dL (ref 0.2–1.2)
Total Protein: 7.7 g/dL (ref 6.0–8.3)

## 2023-09-20 LAB — CBC
HCT: 40.5 % (ref 36.0–46.0)
Hemoglobin: 13.7 g/dL (ref 12.0–15.0)
MCHC: 33.8 g/dL (ref 30.0–36.0)
MCV: 94.3 fl (ref 78.0–100.0)
Platelets: 229 10*3/uL (ref 150.0–400.0)
RBC: 4.29 Mil/uL (ref 3.87–5.11)
RDW: 12.8 % (ref 11.5–15.5)
WBC: 4.3 10*3/uL (ref 4.0–10.5)

## 2023-09-20 LAB — LIPID PANEL
Cholesterol: 195 mg/dL (ref 0–200)
HDL: 74.5 mg/dL (ref >39.00)
LDL Cholesterol: 105 mg/dL — ABNORMAL HIGH (ref 0–99)
NonHDL: 120.05
Total CHOL/HDL Ratio: 3
Triglycerides: 73 mg/dL (ref 0.0–149.0)
VLDL: 14.6 mg/dL (ref 0.0–40.0)

## 2023-09-20 NOTE — Assessment & Plan Note (Signed)
 Continue omeprazole 40 mg daily and controlled.

## 2023-09-20 NOTE — Assessment & Plan Note (Signed)
 Flu shot declines. Shingrix complete. Tetanus up to date. Colonoscopy up to date. Mammogram up to date, pap smear collected today. Counseled about sun safety and mole surveillance. Counseled about the dangers of distracted driving. Given 10 year screening recommendations.

## 2023-09-20 NOTE — Progress Notes (Signed)
   Subjective:   Patient ID: Jenny Mckenzie, female    DOB: December 16, 1965, 58 y.o.   MRN: [id number]  HPI The patient is here for physical.  PMH, [facility], social history reviewed and updated  Review of Systems  Constitutional: Negative.   HENT: Negative.    Eyes: Negative.   Respiratory:  Negative for cough, chest tightness and shortness of breath.   Cardiovascular:  Negative for chest pain, palpitations and leg swelling.  Gastrointestinal:  Negative for abdominal distention, abdominal pain, constipation, diarrhea, nausea and vomiting.  Musculoskeletal: Negative.   Skin: Negative.   Neurological: Negative.   Psychiatric/Behavioral: Negative.      Objective:  Physical Exam Exam conducted with a chaperone present.  Constitutional:      Appearance: She is well-developed.  HENT:     Head: Normocephalic and atraumatic.  Cardiovascular:     Rate and Rhythm: Normal rate and regular rhythm.  Pulmonary:     Effort: Pulmonary effort is normal. No respiratory distress.     Breath sounds: Normal breath sounds. No wheezing or rales.  Abdominal:     General: Bowel sounds are normal. There is no distension.     Palpations: Abdomen is soft.     Tenderness: There is no abdominal tenderness. There is no rebound.  Genitourinary:    Vagina: No vaginal discharge.     Comments: Pap collected Musculoskeletal:     Cervical back: Normal range of motion.  Skin:    General: Skin is warm and dry.  Neurological:     Mental Status: She is alert and oriented to person, place, and time.     Coordination: Coordination normal.     Vitals:   09/20/23 0852  BP: 102/70  Pulse: 67  Temp: 98.5 F (36.9 C)  SpO2: 97%  Weight: 107 lb 3.2 oz (48.6 kg)  Height: 5\' 4"  (1.626 m)    Assessment & Plan:

## 2023-09-22 LAB — CYTOLOGY - PAP
Chlamydia: NEGATIVE
Comment: NEGATIVE
Comment: NEGATIVE
Comment: NEGATIVE
Comment: NORMAL
Diagnosis: NEGATIVE
Diagnosis: REACTIVE
High risk HPV: NEGATIVE
Neisseria Gonorrhea: NEGATIVE
Trichomonas: NEGATIVE

## 2024-01-12 IMAGING — US US ABDOMEN LIMITED
1 series · 15 of 25 positions shown · non-contrast
Comparison: None.

CLINICAL DATA: Epigastric pain.

EXAM:
ULTRASOUND ABDOMEN LIMITED RIGHT UPPER QUADRANT

[Series 1: us abdomen limited ruq mc & wl · 15 of 66 slices shown]
[im 1/66]
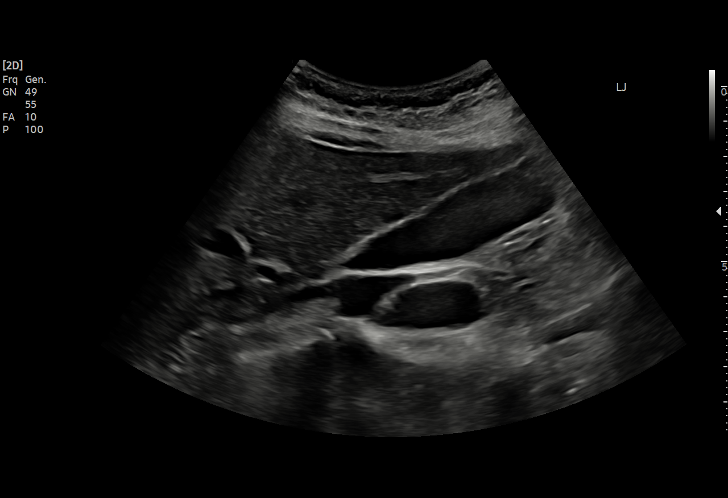
[im 6/66]
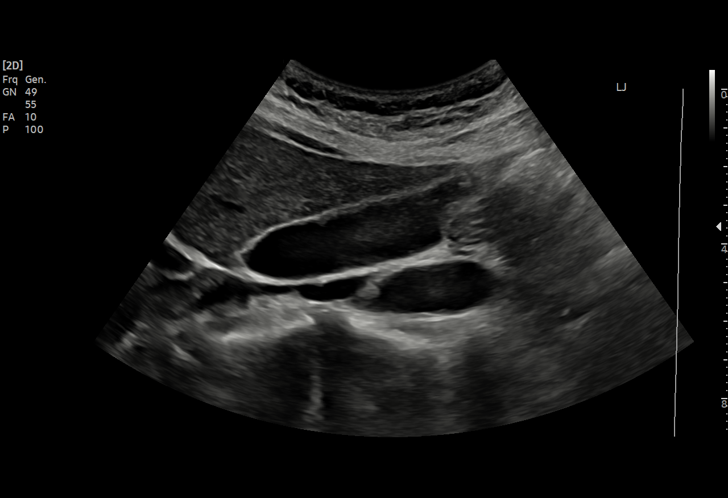
[im 11/66]
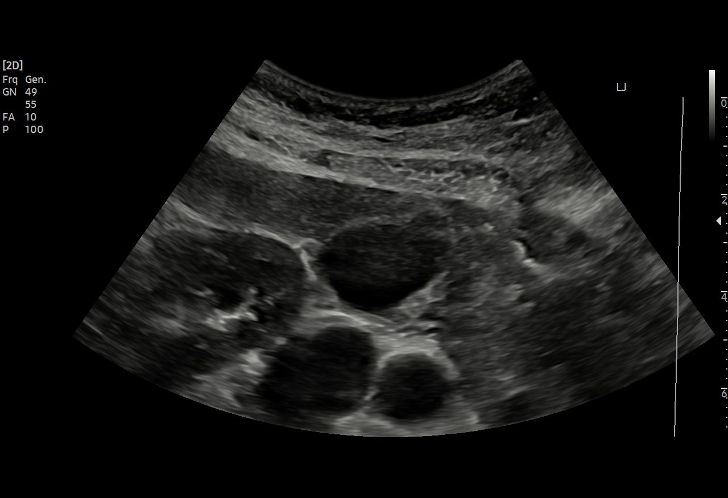
[im 14/66]
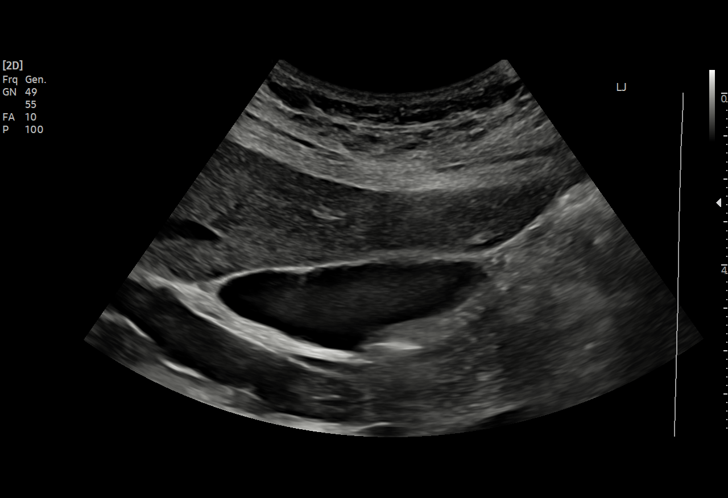
[im 19/66]
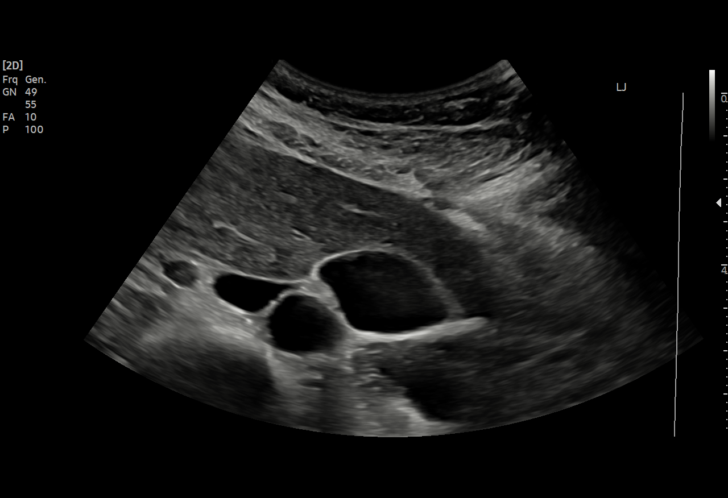
[im 25/66]
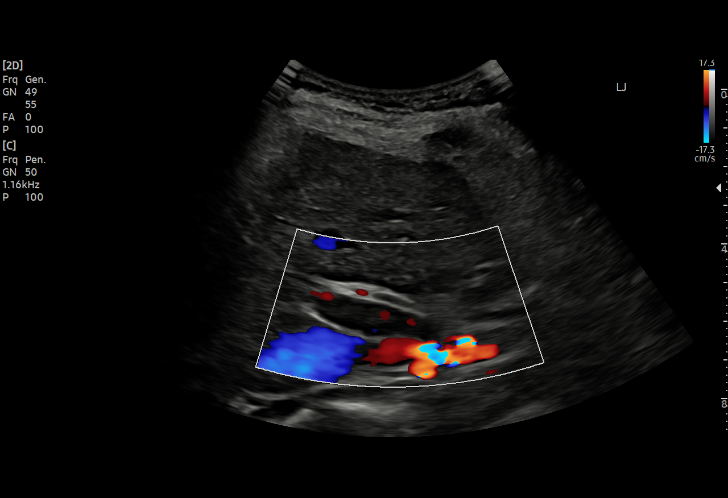
[im 28/66]
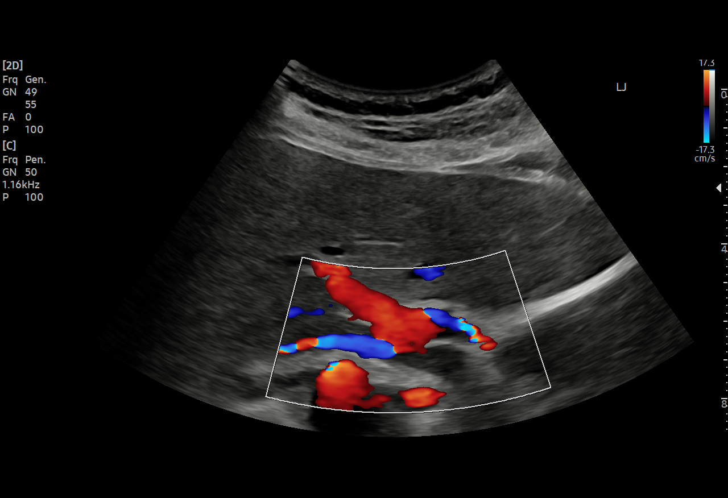
[im 33/66]
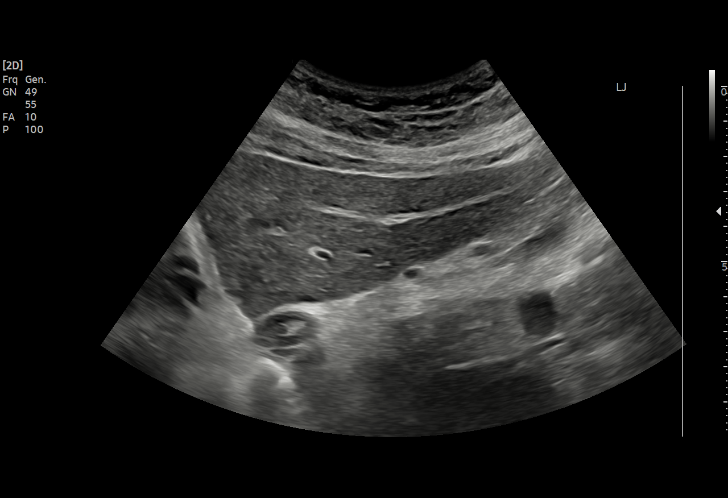
[im 38/66]
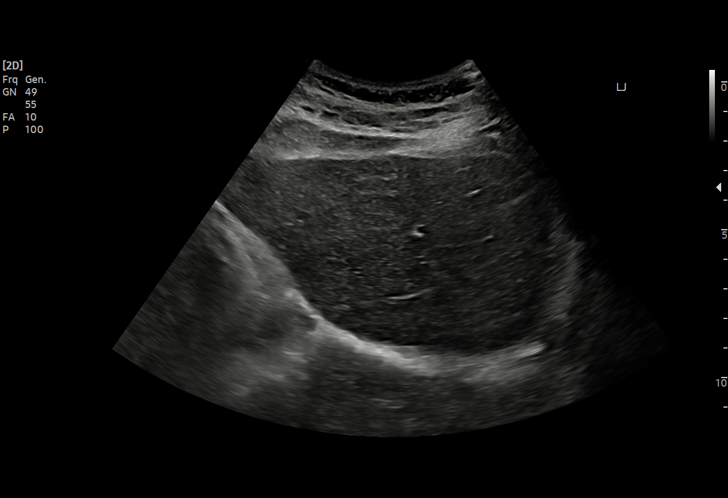
[im 41/66]
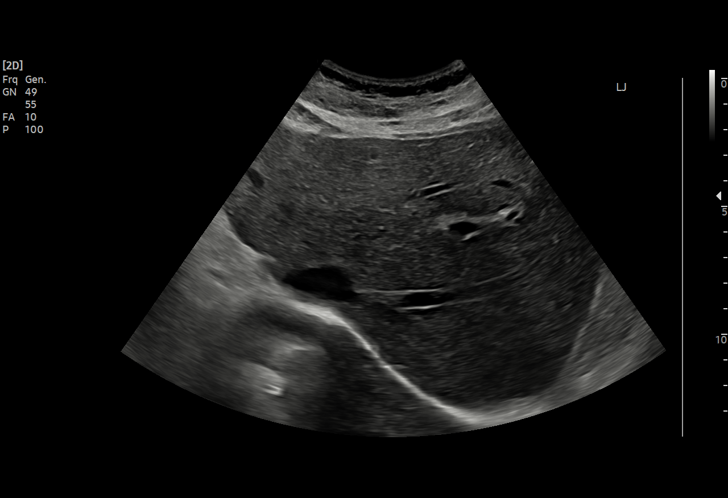
[im 47/66]
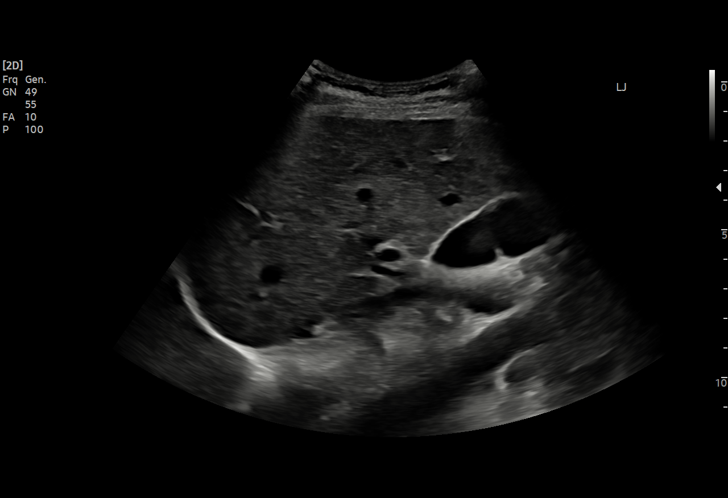
[im 52/66]
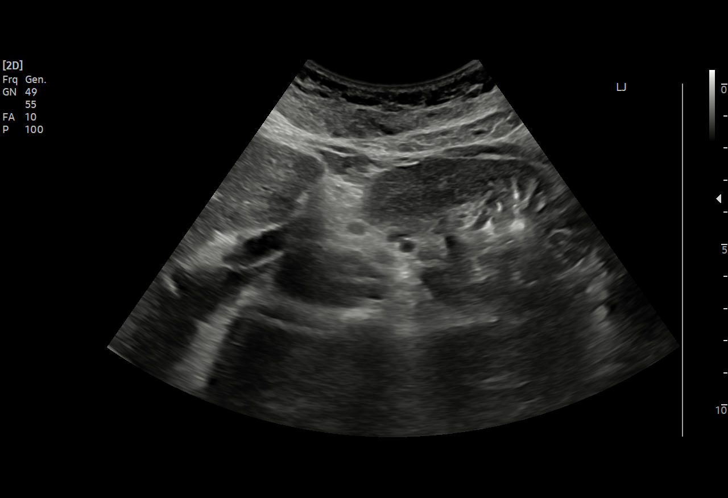
[im 55/66]
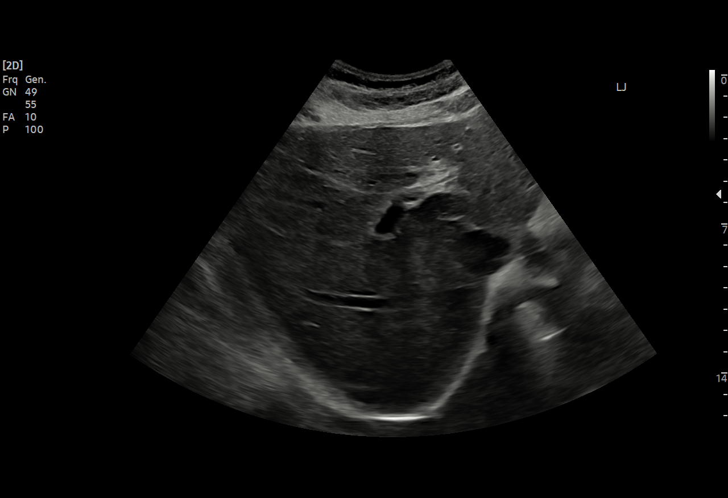
[im 60/66]
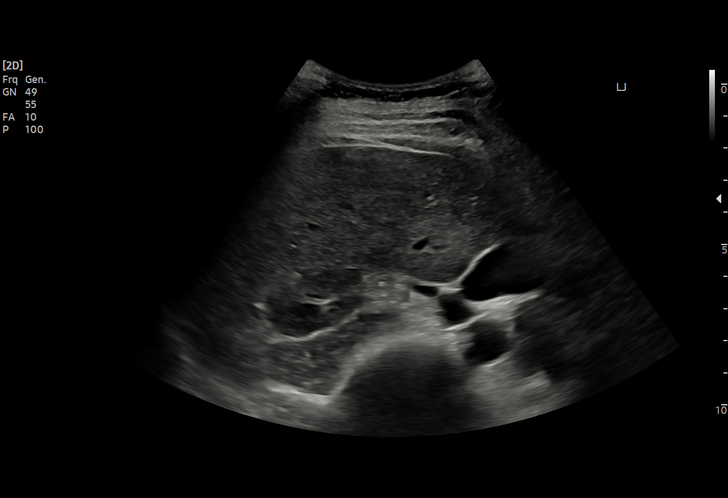
[im 66/66]
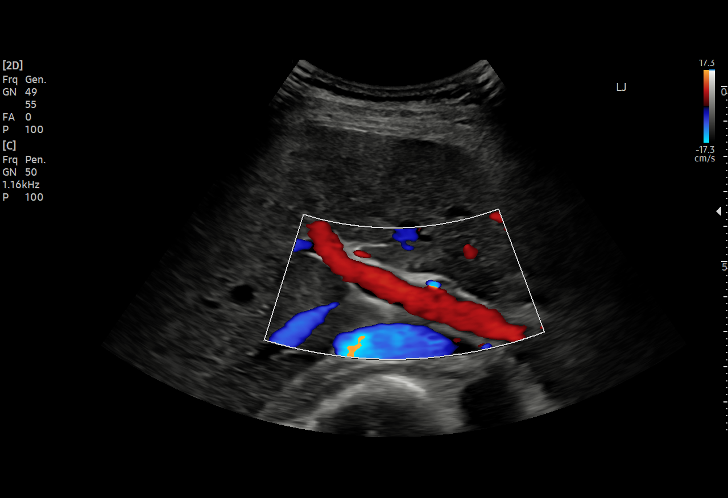

[15 of 25 positions shown; findings below may reference images not displayed]

FINDINGS: Gallbladder:

A moderate to marked amount of heterogeneous echogenic sludge is
seen within the gallbladder lumen. No gallstones or wall thickening
visualized (2.1 mm). No sonographic Murphy sign noted by
sonographer.

Common bile duct:

Diameter: 2.0 mm

Liver:

No focal lesion identified. Within normal limits in parenchymal
echogenicity. Portal vein is patent on color Doppler imaging with
normal direction of blood flow towards the liver.

Other: None.
IMPRESSION: Gallbladder sludge without evidence of cholelithiasis or
cholecystitis.

## 2024-06-05 LAB — HM MAMMOGRAPHY

## 2024-06-08 ENCOUNTER — Encounter: Payer: Self-pay | Admitting: Internal Medicine
